# Patient Record
Sex: Male | Born: 1945 | Race: White | Hispanic: No | Marital: Single | Smoking: Former smoker
Health system: Southern US, Community
[De-identification: ages and names within clinical notes are randomized; demographics above are authoritative.]

## PROBLEM LIST (undated history)

## (undated) DIAGNOSIS — E7800 Pure hypercholesterolemia, unspecified: Secondary | ICD-10-CM

## (undated) DIAGNOSIS — F419 Anxiety disorder, unspecified: Secondary | ICD-10-CM

## (undated) DIAGNOSIS — R001 Bradycardia, unspecified: Secondary | ICD-10-CM

## (undated) DIAGNOSIS — H547 Unspecified visual loss: Secondary | ICD-10-CM

## (undated) DIAGNOSIS — H3530 Unspecified macular degeneration: Secondary | ICD-10-CM

## (undated) DIAGNOSIS — T07XXXA Unspecified multiple injuries, initial encounter: Secondary | ICD-10-CM

## (undated) DIAGNOSIS — M1990 Unspecified osteoarthritis, unspecified site: Secondary | ICD-10-CM

## (undated) DIAGNOSIS — Z87898 Personal history of other specified conditions: Secondary | ICD-10-CM

## (undated) DIAGNOSIS — J40 Bronchitis, not specified as acute or chronic: Secondary | ICD-10-CM

## (undated) HISTORY — PX: SEPTOPLASTY: SUR1290

## (undated) HISTORY — PX: CERVICAL FUSION: SHX112

---

## 2001-06-03 ENCOUNTER — Encounter: Payer: Self-pay | Admitting: Emergency Medicine

## 2001-06-03 ENCOUNTER — Emergency Department (HOSPITAL_COMMUNITY): Admission: EM | Admit: 2001-06-03 | Discharge: 2001-06-03 | Payer: Self-pay | Admitting: Emergency Medicine

## 2004-08-13 ENCOUNTER — Emergency Department (HOSPITAL_COMMUNITY): Admission: EM | Admit: 2004-08-13 | Discharge: 2004-08-13 | Payer: Self-pay | Admitting: Emergency Medicine

## 2013-01-08 ENCOUNTER — Other Ambulatory Visit (HOSPITAL_COMMUNITY): Payer: Self-pay | Admitting: Internal Medicine

## 2013-01-08 DIAGNOSIS — I714 Abdominal aortic aneurysm, without rupture: Secondary | ICD-10-CM

## 2013-01-12 ENCOUNTER — Ambulatory Visit (HOSPITAL_COMMUNITY)
Admission: RE | Admit: 2013-01-12 | Discharge: 2013-01-12 | Disposition: A | Discharge status: Home / Self Care | Payer: Medicare Other | Source: Ambulatory Visit | Attending: Internal Medicine | Admitting: Internal Medicine

## 2013-01-12 DIAGNOSIS — I714 Abdominal aortic aneurysm, without rupture: Secondary | ICD-10-CM

## 2013-01-12 DIAGNOSIS — I77811 Abdominal aortic ectasia: Secondary | ICD-10-CM | POA: Insufficient documentation

## 2013-01-12 DIAGNOSIS — Z1389 Encounter for screening for other disorder: Secondary | ICD-10-CM | POA: Insufficient documentation

## 2013-07-26 ENCOUNTER — Other Ambulatory Visit: Payer: Self-pay | Admitting: Orthopedic Surgery

## 2013-07-26 NOTE — Progress Notes (Signed)
Preoperative surgical orders have been place into the Epic hospital system for Vincent Gaines on 07/26/2013, 12:34 PM  by Patrica Duel for surgery on 08-13-2013.  Preop Total Knee orders including Experal, PO Tylenol, and IV Decadron as long as there are no contraindications to the above medications. Avel Peace, PA-C

## 2013-07-30 ENCOUNTER — Encounter (HOSPITAL_COMMUNITY): Payer: Self-pay | Admitting: Pharmacy Technician

## 2013-08-06 ENCOUNTER — Encounter (HOSPITAL_COMMUNITY)
Admission: RE | Admit: 2013-08-06 | Discharge: 2013-08-06 | Disposition: A | Discharge status: Home / Self Care | Payer: Medicare Other | Source: Ambulatory Visit | Attending: Orthopedic Surgery | Admitting: Orthopedic Surgery

## 2013-08-06 ENCOUNTER — Encounter (HOSPITAL_COMMUNITY): Payer: Self-pay

## 2013-08-06 ENCOUNTER — Encounter (INDEPENDENT_AMBULATORY_CARE_PROVIDER_SITE_OTHER): Payer: Self-pay

## 2013-08-06 DIAGNOSIS — J40 Bronchitis, not specified as acute or chronic: Secondary | ICD-10-CM

## 2013-08-06 DIAGNOSIS — Z01818 Encounter for other preprocedural examination: Secondary | ICD-10-CM | POA: Insufficient documentation

## 2013-08-06 DIAGNOSIS — Z01810 Encounter for preprocedural cardiovascular examination: Secondary | ICD-10-CM | POA: Insufficient documentation

## 2013-08-06 DIAGNOSIS — H547 Unspecified visual loss: Secondary | ICD-10-CM

## 2013-08-06 DIAGNOSIS — Z01812 Encounter for preprocedural laboratory examination: Secondary | ICD-10-CM | POA: Insufficient documentation

## 2013-08-06 HISTORY — DX: Anxiety disorder, unspecified: F41.9

## 2013-08-06 HISTORY — DX: Unspecified visual loss: H54.7

## 2013-08-06 HISTORY — DX: Personal history of other specified conditions: Z87.898

## 2013-08-06 HISTORY — DX: Unspecified multiple injuries, initial encounter: T07.XXXA

## 2013-08-06 HISTORY — DX: Bronchitis, not specified as acute or chronic: J40

## 2013-08-06 HISTORY — DX: Unspecified osteoarthritis, unspecified site: M19.90

## 2013-08-06 HISTORY — DX: Bradycardia, unspecified: R00.1

## 2013-08-06 HISTORY — PX: EXPLORATORY LAPAROTOMY: SUR591

## 2013-08-06 LAB — COMPREHENSIVE METABOLIC PANEL
AST: 17 U/L (ref 0–37)
Albumin: 4 g/dL (ref 3.5–5.2)
Alkaline Phosphatase: 52 U/L (ref 39–117)
BUN: 12 mg/dL (ref 6–23)
Potassium: 4.3 mEq/L (ref 3.5–5.1)
Total Protein: 7.1 g/dL (ref 6.0–8.3)

## 2013-08-06 LAB — APTT: aPTT: 30 seconds (ref 24–37)

## 2013-08-06 LAB — SURGICAL PCR SCREEN
MRSA, PCR: NEGATIVE
Staphylococcus aureus: NEGATIVE

## 2013-08-06 LAB — URINALYSIS, ROUTINE W REFLEX MICROSCOPIC
Nitrite: NEGATIVE
Specific Gravity, Urine: 1.005 (ref 1.005–1.030)
Urobilinogen, UA: 0.2 mg/dL (ref 0.0–1.0)

## 2013-08-06 LAB — CBC
HCT: 39.3 % (ref 39.0–52.0)
MCHC: 34.1 g/dL (ref 30.0–36.0)
Platelets: 248 10*3/uL (ref 150–400)
RDW: 13.8 % (ref 11.5–15.5)

## 2013-08-06 LAB — PROTIME-INR
INR: 1.01 (ref 0.00–1.49)
Prothrombin Time: 13.1 seconds (ref 11.6–15.2)

## 2013-08-06 NOTE — Pre-Procedure Instructions (Signed)
08-06-13 EKG done today.

## 2013-08-06 NOTE — Patient Instructions (Addendum)
20 JEZIAH KRETSCHMER  08/06/2013   Your procedure is scheduled on:  1-5 -2015  Report to [facility] at    0930    AM.  Call this number if you have problems the morning of surgery: [phone]  Or Presurgical Testing [phone](Makiyah Zentz) For Living Will and/or Health Care Power Attorney Forms: please provide copy for your medical record,may bring AM of surgery(Forms should be already notarized -we do not provide this service).     Do not eat food:After Midnight.(may have Clear liquid to drink- 12 midnight- 0600 AM-Apple juice, black coffee, water, clear broth, sodas.), then nothing .    Take these medicines the morning of surgery with A SIP OF WATER: Xanax. Simvastatin.   Do not wear jewelry, make-up or nail polish.  Do not wear lotions, powders, or perfumes. You may wear deodorant.  Do not shave 12 hours prior to first CHG shower(legs and under arms).(face and neck okay.)  Do not bring valuables to the hospital.(Hospital is not responsible for lost valuables).  Contacts, dentures or removable bridgework, body piercing, hair pins may not be worn into surgery.  Leave suitcase in the car. After surgery it may be brought to your room.  For patients admitted to the hospital, checkout time is 11:00 AM the day of discharge.   Patients discharged the day of surgery will not be allowed to drive home. Must have responsible person with you x 24 hours once discharged.  Name and phone number of your driver: Akash Winski [phone] cell  Special Instructions: CHG(Chlorhedine 4%-"Hibiclens","Betasept","Aplicare") Shower Use Special Wash: see special instructions.(avoid face and genitals)   Please read over the following fact sheets that you were given: MRSA Information, Blood Transfusion fact sheet, Incentive Spirometry Instruction.  Remember : Type/Screen "Blue armbands" - may not be removed once applied(would result in being retested AM of surgery, if  removed).  Failure to follow these instructions may result in Cancellation of your surgery.   Patient signature_______________________________________________________

## 2013-08-13 ENCOUNTER — Encounter (HOSPITAL_COMMUNITY)
Admission: RE | Disposition: A | Discharge status: Home Health | Payer: Self-pay | Source: Ambulatory Visit | Attending: Orthopedic Surgery

## 2013-08-13 ENCOUNTER — Encounter (HOSPITAL_COMMUNITY): Payer: Self-pay | Admitting: Orthopedic Surgery

## 2013-08-13 ENCOUNTER — Inpatient Hospital Stay (HOSPITAL_COMMUNITY)
Admission: RE | Admit: 2013-08-13 | Discharge: 2013-08-15 | DRG: 470 | Disposition: A | Discharge status: Home Health | Payer: Medicare Other | Source: Ambulatory Visit | Attending: Orthopedic Surgery | Admitting: Orthopedic Surgery

## 2013-08-13 ENCOUNTER — Inpatient Hospital Stay (HOSPITAL_COMMUNITY): Payer: Medicare Other | Admitting: Certified Registered Nurse Anesthetist

## 2013-08-13 ENCOUNTER — Other Ambulatory Visit: Payer: Self-pay | Admitting: Orthopedic Surgery

## 2013-08-13 ENCOUNTER — Encounter (HOSPITAL_COMMUNITY): Payer: Medicare Other | Admitting: Certified Registered Nurse Anesthetist

## 2013-08-13 DIAGNOSIS — Z87891 Personal history of nicotine dependence: Secondary | ICD-10-CM

## 2013-08-13 DIAGNOSIS — H353290 Exudative age-related macular degeneration, unspecified eye, stage unspecified: Secondary | ICD-10-CM | POA: Diagnosis present

## 2013-08-13 DIAGNOSIS — H547 Unspecified visual loss: Secondary | ICD-10-CM | POA: Diagnosis present

## 2013-08-13 DIAGNOSIS — D62 Acute posthemorrhagic anemia: Secondary | ICD-10-CM | POA: Diagnosis not present

## 2013-08-13 DIAGNOSIS — F411 Generalized anxiety disorder: Secondary | ICD-10-CM | POA: Diagnosis present

## 2013-08-13 DIAGNOSIS — M179 Osteoarthritis of knee, unspecified: Secondary | ICD-10-CM | POA: Diagnosis present

## 2013-08-13 DIAGNOSIS — Z6825 Body mass index (BMI) 25.0-25.9, adult: Secondary | ICD-10-CM

## 2013-08-13 DIAGNOSIS — E7800 Pure hypercholesterolemia, unspecified: Secondary | ICD-10-CM | POA: Diagnosis present

## 2013-08-13 DIAGNOSIS — M1710 Unilateral primary osteoarthritis, unspecified knee: Principal | ICD-10-CM | POA: Diagnosis present

## 2013-08-13 DIAGNOSIS — Z96652 Presence of left artificial knee joint: Secondary | ICD-10-CM

## 2013-08-13 DIAGNOSIS — M1712 Unilateral primary osteoarthritis, left knee: Secondary | ICD-10-CM | POA: Diagnosis present

## 2013-08-13 DIAGNOSIS — E871 Hypo-osmolality and hyponatremia: Secondary | ICD-10-CM | POA: Diagnosis not present

## 2013-08-13 HISTORY — PX: TOTAL KNEE ARTHROPLASTY: SHX125

## 2013-08-13 HISTORY — DX: Pure hypercholesterolemia, unspecified: E78.00

## 2013-08-13 LAB — TYPE AND SCREEN
ABO/RH(D): B POS
Antibody Screen: NEGATIVE

## 2013-08-13 LAB — ABO/RH: ABO/RH(D): B POS

## 2013-08-13 SURGERY — ARTHROPLASTY, KNEE, TOTAL
Anesthesia: Spinal | Site: Knee | Laterality: Left

## 2013-08-13 MED ORDER — METOCLOPRAMIDE HCL 5 MG/ML IJ SOLN: 5.0000 mg | Freq: Three times a day (TID) | INTRAMUSCULAR | Status: DC | PRN

## 2013-08-13 MED ORDER — ONDANSETRON HCL 4 MG/2ML IJ SOLN
INTRAMUSCULAR | Status: AC
  Filled 2013-08-13: qty 2

## 2013-08-13 MED ORDER — BUPIVACAINE HCL (PF) 0.25 % IJ SOLN
INTRAMUSCULAR | Status: AC
  Filled 2013-08-13: qty 30

## 2013-08-13 MED ORDER — FLEET ENEMA 7-19 GM/118ML RE ENEM
1.0000 | ENEMA | Freq: Once | RECTAL | Status: AC | PRN
Start: 2013-08-13 — End: 2013-08-13

## 2013-08-13 MED ORDER — SODIUM CHLORIDE 0.9 % IJ SOLN
INTRAMUSCULAR | Status: AC
  Filled 2013-08-13: qty 50

## 2013-08-13 MED ORDER — METHOCARBAMOL 100 MG/ML IJ SOLN
500.0000 mg | Freq: Four times a day (QID) | INTRAVENOUS | Status: DC | PRN
  Administered 2013-08-13: 500 mg via INTRAVENOUS
  Filled 2013-08-13: qty 5

## 2013-08-13 MED ORDER — PROPOFOL 10 MG/ML IV BOLUS
INTRAVENOUS | Status: AC
  Filled 2013-08-13: qty 20

## 2013-08-13 MED ORDER — DEXAMETHASONE SODIUM PHOSPHATE 10 MG/ML IJ SOLN
10.0000 mg | Freq: Every day | INTRAMUSCULAR | Status: AC
  Filled 2013-08-13: qty 1

## 2013-08-13 MED ORDER — ONDANSETRON HCL 4 MG/2ML IJ SOLN
INTRAMUSCULAR | Status: DC | PRN
  Administered 2013-08-13: 4 mg via INTRAVENOUS

## 2013-08-13 MED ORDER — OXYCODONE HCL 5 MG PO TABS
5.0000 mg | ORAL_TABLET | ORAL | Status: DC | PRN
  Administered 2013-08-13 (×3): 5 mg via ORAL
  Administered 2013-08-13 – 2013-08-15 (×7): 10 mg via ORAL
  Filled 2013-08-13 (×2): qty 2
  Filled 2013-08-13: qty 1
  Filled 2013-08-13: qty 2
  Filled 2013-08-13: qty 1
  Filled 2013-08-13 (×2): qty 2
  Filled 2013-08-13: qty 1
  Filled 2013-08-13 (×2): qty 2

## 2013-08-13 MED ORDER — KETOROLAC TROMETHAMINE 15 MG/ML IJ SOLN
INTRAMUSCULAR | Status: AC
Start: 2013-08-13 — End: 2013-08-14
  Filled 2013-08-13: qty 1

## 2013-08-13 MED ORDER — ACETAMINOPHEN 500 MG PO TABS
1000.0000 mg | ORAL_TABLET | Freq: Four times a day (QID) | ORAL | Status: AC
  Administered 2013-08-13 – 2013-08-14 (×4): 1000 mg via ORAL
  Filled 2013-08-13 (×5): qty 2

## 2013-08-13 MED ORDER — SIMVASTATIN 40 MG PO TABS
40.0000 mg | ORAL_TABLET | Freq: Every day | ORAL | Status: DC
  Administered 2013-08-14 – 2013-08-15 (×2): 40 mg via ORAL
  Filled 2013-08-13 (×3): qty 1

## 2013-08-13 MED ORDER — PROMETHAZINE HCL 25 MG/ML IJ SOLN: 6.2500 mg | INTRAMUSCULAR | Status: DC | PRN

## 2013-08-13 MED ORDER — PROPOFOL INFUSION 10 MG/ML OPTIME
INTRAVENOUS | Status: DC | PRN
  Administered 2013-08-13: 140 ug/kg/min via INTRAVENOUS

## 2013-08-13 MED ORDER — CHLORHEXIDINE GLUCONATE 4 % EX LIQD: 60.0000 mL | Freq: Once | CUTANEOUS | Status: DC

## 2013-08-13 MED ORDER — FENTANYL CITRATE 0.05 MG/ML IJ SOLN: 25.0000 ug | INTRAMUSCULAR | Status: DC | PRN

## 2013-08-13 MED ORDER — METOCLOPRAMIDE HCL 10 MG PO TABS: 5.0000 mg | ORAL_TABLET | Freq: Three times a day (TID) | ORAL | Status: DC | PRN

## 2013-08-13 MED ORDER — MEPERIDINE HCL 50 MG/ML IJ SOLN: 6.2500 mg | INTRAMUSCULAR | Status: DC | PRN

## 2013-08-13 MED ORDER — DOCUSATE SODIUM 100 MG PO CAPS
100.0000 mg | ORAL_CAPSULE | Freq: Two times a day (BID) | ORAL | Status: DC
  Administered 2013-08-13 – 2013-08-15 (×4): 100 mg via ORAL

## 2013-08-13 MED ORDER — BUPIVACAINE LIPOSOME 1.3 % IJ SUSP
20.0000 mL | Freq: Once | INTRAMUSCULAR | Status: DC
  Filled 2013-08-13: qty 20

## 2013-08-13 MED ORDER — KETOROLAC TROMETHAMINE 15 MG/ML IJ SOLN
7.5000 mg | Freq: Four times a day (QID) | INTRAMUSCULAR | Status: AC | PRN
  Administered 2013-08-13: 7.5 mg via INTRAVENOUS

## 2013-08-13 MED ORDER — ONDANSETRON HCL 4 MG PO TABS: 4.0000 mg | ORAL_TABLET | Freq: Four times a day (QID) | ORAL | Status: DC | PRN

## 2013-08-13 MED ORDER — MORPHINE SULFATE 2 MG/ML IJ SOLN: 1.0000 mg | INTRAMUSCULAR | Status: DC | PRN

## 2013-08-13 MED ORDER — SODIUM CHLORIDE 0.9 % IV SOLN
INTRAVENOUS | Status: DC
Start: 2013-08-13 — End: 2013-08-13

## 2013-08-13 MED ORDER — MIDAZOLAM HCL 2 MG/2ML IJ SOLN
INTRAMUSCULAR | Status: AC
  Filled 2013-08-13: qty 2

## 2013-08-13 MED ORDER — CEFAZOLIN SODIUM-DEXTROSE 2-3 GM-% IV SOLR
INTRAVENOUS | Status: AC
  Filled 2013-08-13: qty 50

## 2013-08-13 MED ORDER — DEXAMETHASONE SODIUM PHOSPHATE 10 MG/ML IJ SOLN
10.0000 mg | Freq: Once | INTRAMUSCULAR | Status: AC
  Administered 2013-08-13: 10 mg via INTRAVENOUS

## 2013-08-13 MED ORDER — BUPIVACAINE IN DEXTROSE 0.75-8.25 % IT SOLN
INTRATHECAL | Status: DC | PRN
  Administered 2013-08-13: 1.5 mL via INTRATHECAL

## 2013-08-13 MED ORDER — RIVAROXABAN 10 MG PO TABS
10.0000 mg | ORAL_TABLET | Freq: Every day | ORAL | Status: DC
  Administered 2013-08-14 – 2013-08-15 (×2): 10 mg via ORAL
  Filled 2013-08-13 (×3): qty 1

## 2013-08-13 MED ORDER — ONDANSETRON HCL 4 MG/2ML IJ SOLN: 4.0000 mg | Freq: Four times a day (QID) | INTRAMUSCULAR | Status: DC | PRN

## 2013-08-13 MED ORDER — 0.9 % SODIUM CHLORIDE (POUR BTL) OPTIME
TOPICAL | Status: DC | PRN
  Administered 2013-08-13: 1000 mL

## 2013-08-13 MED ORDER — DEXAMETHASONE 6 MG PO TABS
10.0000 mg | ORAL_TABLET | Freq: Every day | ORAL | Status: AC
  Administered 2013-08-14: 10 mg via ORAL
  Filled 2013-08-13: qty 1

## 2013-08-13 MED ORDER — LIDOCAINE HCL (CARDIAC) 20 MG/ML IV SOLN
INTRAVENOUS | Status: DC | PRN
  Administered 2013-08-13: 100 mg via INTRAVENOUS

## 2013-08-13 MED ORDER — ALPRAZOLAM 0.5 MG PO TABS
0.5000 mg | ORAL_TABLET | Freq: Three times a day (TID) | ORAL | Status: DC | PRN
  Administered 2013-08-13 – 2013-08-15 (×5): 0.5 mg via ORAL
  Filled 2013-08-13 (×5): qty 1

## 2013-08-13 MED ORDER — ACETAMINOPHEN 10 MG/ML IV SOLN
1000.0000 mg | Freq: Once | INTRAVENOUS | Status: AC
  Administered 2013-08-13: 1000 mg via INTRAVENOUS
  Filled 2013-08-13: qty 100

## 2013-08-13 MED ORDER — LACTATED RINGERS IV SOLN
INTRAVENOUS | Status: DC | PRN
  Administered 2013-08-13: 12:00:00 via INTRAVENOUS

## 2013-08-13 MED ORDER — MENTHOL 3 MG MT LOZG: 1.0000 | LOZENGE | OROMUCOSAL | Status: DC | PRN

## 2013-08-13 MED ORDER — MIDAZOLAM HCL 5 MG/5ML IJ SOLN
INTRAMUSCULAR | Status: DC | PRN
  Administered 2013-08-13: 2 mg via INTRAVENOUS

## 2013-08-13 MED ORDER — SODIUM CHLORIDE 0.9 % IR SOLN
Status: DC | PRN
  Administered 2013-08-13: 1000 mL

## 2013-08-13 MED ORDER — ACETAMINOPHEN 325 MG PO TABS: 650.0000 mg | ORAL_TABLET | Freq: Four times a day (QID) | ORAL | Status: DC | PRN

## 2013-08-13 MED ORDER — BISACODYL 10 MG RE SUPP: 10.0000 mg | Freq: Every day | RECTAL | Status: DC | PRN

## 2013-08-13 MED ORDER — METHOCARBAMOL 500 MG PO TABS
500.0000 mg | ORAL_TABLET | Freq: Four times a day (QID) | ORAL | Status: DC | PRN
  Administered 2013-08-13 – 2013-08-15 (×4): 500 mg via ORAL
  Filled 2013-08-13 (×4): qty 1

## 2013-08-13 MED ORDER — NICOTINE 14 MG/24HR TD PT24
14.0000 mg | MEDICATED_PATCH | Freq: Every day | TRANSDERMAL | Status: DC
  Administered 2013-08-13 – 2013-08-14 (×2): 14 mg via TRANSDERMAL
  Filled 2013-08-13 (×3): qty 1

## 2013-08-13 MED ORDER — SODIUM CHLORIDE 0.9 % IJ SOLN
INTRAMUSCULAR | Status: DC | PRN
  Administered 2013-08-13: 14:00:00

## 2013-08-13 MED ORDER — DEXTROSE-NACL 5-0.9 % IV SOLN
INTRAVENOUS | Status: DC
  Administered 2013-08-13 – 2013-08-14 (×3): via INTRAVENOUS

## 2013-08-13 MED ORDER — TRAMADOL HCL 50 MG PO TABS: 50.0000 mg | ORAL_TABLET | Freq: Four times a day (QID) | ORAL | Status: DC | PRN

## 2013-08-13 MED ORDER — POLYETHYLENE GLYCOL 3350 17 G PO PACK
17.0000 g | PACK | Freq: Every day | ORAL | Status: DC | PRN
Start: 2013-08-13 — End: 2013-08-15

## 2013-08-13 MED ORDER — ACETAMINOPHEN 650 MG RE SUPP: 650.0000 mg | Freq: Four times a day (QID) | RECTAL | Status: DC | PRN

## 2013-08-13 MED ORDER — LIDOCAINE HCL (CARDIAC) 20 MG/ML IV SOLN
INTRAVENOUS | Status: AC
  Filled 2013-08-13: qty 5

## 2013-08-13 MED ORDER — CEFAZOLIN SODIUM-DEXTROSE 2-3 GM-% IV SOLR
2.0000 g | Freq: Four times a day (QID) | INTRAVENOUS | Status: AC
  Administered 2013-08-13 – 2013-08-14 (×2): 2 g via INTRAVENOUS
  Filled 2013-08-13 (×2): qty 50

## 2013-08-13 MED ORDER — DIPHENHYDRAMINE HCL 12.5 MG/5ML PO ELIX: 12.5000 mg | ORAL_SOLUTION | ORAL | Status: DC | PRN

## 2013-08-13 MED ORDER — CEFAZOLIN SODIUM-DEXTROSE 2-3 GM-% IV SOLR
2.0000 g | INTRAVENOUS | Status: AC
  Administered 2013-08-13: 2 g via INTRAVENOUS

## 2013-08-13 MED ORDER — DEXAMETHASONE SODIUM PHOSPHATE 10 MG/ML IJ SOLN
INTRAMUSCULAR | Status: AC
  Filled 2013-08-13: qty 1

## 2013-08-13 MED ORDER — BUPIVACAINE HCL 0.25 % IJ SOLN
INTRAMUSCULAR | Status: DC | PRN
  Administered 2013-08-13: 20 mL

## 2013-08-13 MED ORDER — PHENOL 1.4 % MT LIQD: 1.0000 | OROMUCOSAL | Status: DC | PRN

## 2013-08-13 MED ORDER — LACTATED RINGERS IV SOLN: INTRAVENOUS | Status: DC

## 2013-08-13 SURGICAL SUPPLY — 59 items
BAG SPEC THK2 15X12 ZIP CLS (MISCELLANEOUS) ×1
BAG ZIPLOCK 12X15 (MISCELLANEOUS) ×3 IMPLANT
BANDAGE ELASTIC 6 VELCRO ST LF (GAUZE/BANDAGES/DRESSINGS) ×3 IMPLANT
BANDAGE ESMARK 6X9 LF (GAUZE/BANDAGES/DRESSINGS) ×1 IMPLANT
BLADE SAG 18X100X1.27 (BLADE) ×3 IMPLANT
BLADE SAW SGTL 11.0X1.19X90.0M (BLADE) ×3 IMPLANT
BNDG CMPR 9X6 STRL LF SNTH (GAUZE/BANDAGES/DRESSINGS) ×1
BNDG ESMARK 6X9 LF (GAUZE/BANDAGES/DRESSINGS) ×3
BOWL SMART MIX CTS (DISPOSABLE) ×3 IMPLANT
CAPT RP KNEE ×2 IMPLANT
CEMENT HV SMART SET (Cement) ×6 IMPLANT
CLOSURE WOUND 1/2 X4 (GAUZE/BANDAGES/DRESSINGS) ×1
CUFF TOURN SGL QUICK 34 (TOURNIQUET CUFF) ×3
CUFF TRNQT CYL 34X4X40X1 (TOURNIQUET CUFF) ×1 IMPLANT
DECANTER SPIKE VIAL GLASS SM (MISCELLANEOUS) ×3 IMPLANT
DRAPE EXTREMITY T 121X128X90 (DRAPE) ×3 IMPLANT
DRAPE POUCH INSTRU U-SHP 10X18 (DRAPES) ×3 IMPLANT
DRAPE U-SHAPE 47X51 STRL (DRAPES) ×3 IMPLANT
DRSG ADAPTIC 3X8 NADH LF (GAUZE/BANDAGES/DRESSINGS) ×3 IMPLANT
DURAPREP 26ML APPLICATOR (WOUND CARE) ×3 IMPLANT
ELECT REM PT RETURN 9FT ADLT (ELECTROSURGICAL) ×3
ELECTRODE REM PT RTRN 9FT ADLT (ELECTROSURGICAL) ×1 IMPLANT
EVACUATOR 1/8 PVC DRAIN (DRAIN) ×3 IMPLANT
FACESHIELD LNG OPTICON STERILE (SAFETY) ×15 IMPLANT
GLOVE BIO SURGEON STRL SZ7.5 (GLOVE) IMPLANT
GLOVE BIO SURGEON STRL SZ8 (GLOVE) ×3 IMPLANT
GLOVE BIOGEL PI IND STRL 8 (GLOVE) ×2 IMPLANT
GLOVE BIOGEL PI INDICATOR 8 (GLOVE) ×4
GLOVE SURG SS PI 6.5 STRL IVOR (GLOVE) IMPLANT
GOWN PREVENTION PLUS LG XLONG (DISPOSABLE) ×3 IMPLANT
GOWN STRL REIN XL XLG (GOWN DISPOSABLE) IMPLANT
HANDPIECE INTERPULSE COAX TIP (DISPOSABLE) ×3
IMMOBILIZER KNEE 20 (SOFTGOODS) ×3
IMMOBILIZER KNEE 20 THIGH 36 (SOFTGOODS) ×1 IMPLANT
KIT BASIN OR (CUSTOM PROCEDURE TRAY) ×3 IMPLANT
MANIFOLD NEPTUNE II (INSTRUMENTS) ×3 IMPLANT
NDL SAFETY ECLIPSE 18X1.5 (NEEDLE) ×2 IMPLANT
NEEDLE HYPO 18GX1.5 SHARP (NEEDLE) ×6
NS IRRIG 1000ML POUR BTL (IV SOLUTION) ×3 IMPLANT
PACK TOTAL JOINT (CUSTOM PROCEDURE TRAY) ×3 IMPLANT
PAD ABD 8X10 STRL (GAUZE/BANDAGES/DRESSINGS) ×3 IMPLANT
PADDING CAST ABS 6INX4YD NS (CAST SUPPLIES) ×2
PADDING CAST ABS COTTON 6X4 NS (CAST SUPPLIES) IMPLANT
PADDING CAST COTTON 6X4 STRL (CAST SUPPLIES) ×9 IMPLANT
POSITIONER SURGICAL ARM (MISCELLANEOUS) ×3 IMPLANT
SET HNDPC FAN SPRY TIP SCT (DISPOSABLE) ×1 IMPLANT
SPONGE GAUZE 4X4 12PLY (GAUZE/BANDAGES/DRESSINGS) ×3 IMPLANT
STRIP CLOSURE SKIN 1/2X4 (GAUZE/BANDAGES/DRESSINGS) ×3 IMPLANT
SUCTION FRAZIER 12FR DISP (SUCTIONS) ×3 IMPLANT
SUT MNCRL AB 4-0 PS2 18 (SUTURE) ×3 IMPLANT
SUT VIC AB 2-0 CT1 27 (SUTURE) ×9
SUT VIC AB 2-0 CT1 TAPERPNT 27 (SUTURE) ×3 IMPLANT
SUT VLOC 180 0 24IN GS25 (SUTURE) ×3 IMPLANT
SYR 20CC LL (SYRINGE) ×3 IMPLANT
SYR 50ML LL SCALE MARK (SYRINGE) ×3 IMPLANT
TOWEL OR 17X26 10 PK STRL BLUE (TOWEL DISPOSABLE) ×6 IMPLANT
TRAY FOLEY CATH 14FRSI W/METER (CATHETERS) ×3 IMPLANT
WATER STERILE IRR 1500ML POUR (IV SOLUTION) ×5 IMPLANT
WRAP KNEE MAXI GEL POST OP (GAUZE/BANDAGES/DRESSINGS) ×3 IMPLANT

## 2013-08-13 NOTE — Anesthesia Procedure Notes (Signed)
Spinal  Patient location during procedure: OR Staffing Anesthesiologist: Intisar Claudio Performed by: anesthesiologist  Preanesthetic Checklist Completed: patient identified, site marked, surgical consent, pre-op evaluation, timeout performed, IV checked, risks and benefits discussed and monitors and equipment checked Spinal Block Patient position: sitting Prep: Betadine Patient monitoring: heart rate, continuous pulse ox and blood pressure Approach: right paramedian Location: L3-4 Injection technique: single-shot Needle Needle type: Sprotte  Needle gauge: 24 G Needle length: 9 cm Additional Notes Expiration date of kit checked and confirmed. Patient tolerated procedure well, without complications.     

## 2013-08-13 NOTE — Progress Notes (Signed)
Utilization review completed.  

## 2013-08-13 NOTE — Transfer of Care (Signed)
Immediate Anesthesia Transfer of Care Note  Patient: Vincent Gaines  Procedure(s) Performed: Procedure(s) (LRB): LEFT TOTAL KNEE ARTHROPLASTY (Left)  Patient Location: PACU  Anesthesia Type: Spinal  Level of Consciousness: sedated, patient cooperative and responds to stimulation  Airway & Oxygen Therapy: Patient Spontanous Breathing and Patient connected to face mask oxgen  Post-op Assessment: Report given to PACU RN and Post -op Vital signs reviewed and stable  Post vital signs: Reviewed and stable  Complications: No apparent anesthesia complications

## 2013-08-13 NOTE — Plan of Care (Signed)
Problem: Consults Goal: Diagnosis- Total Joint Replacement Primary Total Knee     

## 2013-08-13 NOTE — Preoperative (Signed)
Beta Blockers   Reason not to administer Beta Blockers:Not Applicable 

## 2013-08-13 NOTE — Op Note (Signed)
Pre-operative diagnosis- Osteoarthritis  Left knee(s)  Post-operative diagnosis- Osteoarthritis Left knee(s)  Procedure-  Left  Total Knee Arthroplasty  Surgeon- Dione Plover. Cydney Alvarenga, MD  Assistant- Arlee Muslim, PA-C   Anesthesia-  Spinal EBL-* No blood loss amount entered *  Drains Hemovac  Tourniquet time-  Total Tourniquet Time Documented: Thigh (Left) - 32 minutes Total: Thigh (Left) - 32 minutes    Complications- None  Condition-PACU - hemodynamically stable.   Brief Clinical Note   Vincent Gaines is a 68 y.o. year old male with end stage OA of his left knee with progressively worsening pain and dysfunction. He has constant pain, with activity and at rest and significant functional deficits with difficulties even with ADLs. He has had extensive non-op management including analgesics, injections of cortisone and viscosupplements, and home exercise program, but remains in significant pain with significant dysfunction. Radiographs show bone on bone arthritis medial and patellofemoral with significant varus deformity. He presents now for left Total Knee Arthroplasty.     Procedure in detail---   The patient is brought into the operating room and positioned supine on the operating table. After successful administration of  Spinal,   a tourniquet is placed high on the  Left thigh(s) and the lower extremity is prepped and draped in the usual sterile fashion. Time out is performed by the operating team and then the  Left lower extremity is wrapped in Esmarch, knee flexed and the tourniquet inflated to 300 mmHg.       A midline incision is made with a ten blade through the subcutaneous tissue to the level of the extensor mechanism. A fresh blade is used to make a medial parapatellar arthrotomy. Soft tissue over the proximal medial tibia is subperiosteally elevated to the joint line with a knife and into the semimembranosus bursa with a Cobb elevator. Soft tissue over the proximal lateral  tibia is elevated with attention being paid to avoiding the patellar tendon on the tibial tubercle. The patella is everted, knee flexed 90 degrees and the ACL and PCL are removed. Findings are bone on bone medial and patellofemoral with large global osteophytes.      The drill is used to create a starting hole in the distal femur and the canal is thoroughly irrigated with sterile saline to remove the fatty contents. The 5 degree Left  valgus alignment guide is placed into the femoral canal and the distal femoral cutting block is pinned to remove 10 mm off the distal femur. Resection is made with an oscillating saw.      The tibia is subluxed forward and the menisci are removed. The extramedullary alignment guide is placed referencing proximally at the medial aspect of the tibial tubercle and distally along the second metatarsal axis and tibial crest. The block is pinned to remove 17mm off the more deficient medial  side. Resection is made with an oscillating saw. Size 4is the most appropriate size for the tibia and the proximal tibia is prepared with the modular drill and keel punch for that size.      The femoral sizing guide is placed and size 4 narrow is most appropriate. Rotation is marked off the epicondylar axis and confirmed by creating a rectangular flexion gap at 90 degrees. The size 4 cutting block is pinned in this rotation and the anterior, posterior and chamfer cuts are made with the oscillating saw. The intercondylar block is then placed and that cut is made.      Trial size 4  tibial component, trial size 4 narrow posterior stabilized femur and a 12.5  mm posterior stabilized rotating platform insert trial is placed. Full extension is achieved with excellent varus/valgus and anterior/posterior balance throughout full range of motion. The patella is everted and thickness measured to be 27  mm. Free hand resection is taken to 15 mm, a 41 template is placed, lug holes are drilled, trial patella is  placed, and it tracks normally. Osteophytes are removed off the posterior femur with the trial in place. All trials are removed and the cut bone surfaces prepared with pulsatile lavage. Cement is mixed and once ready for implantation, the size 4 tibial implant, size  4 narrow posterior stabilized femoral component, and the size 41 patella are cemented in place and the patella is held with the clamp. The trial insert is placed and the knee held in full extension. The Exparel (20 ml mixed with 30 ml saline) and .25% Bupivicaine, are injected into the extensor mechanism, posterior capsule, medial and lateral gutters and subcutaneous tissues.  All extruded cement is removed and once the cement is hard the permanent 12.5 mm posterior stabilized rotating platform insert is placed into the tibial tray.      The wound is copiously irrigated with saline solution and the extensor mechanism closed over a hemovac drain with #1 PDS suture. The tourniquet is released for a total tourniquet time of 31  minutes. Flexion against gravity is 140 degrees and the patella tracks normally. Subcutaneous tissue is closed with 2.0 vicryl and subcuticular with running 4.0 Monocryl. The incision is cleaned and dried and steri-strips and a bulky sterile dressing are applied. The limb is placed into a knee immobilizer and the patient is awakened and transported to recovery in stable condition.      Please note that a surgical assistant was a medical necessity for this procedure in order to perform it in a safe and expeditious manner. Surgical assistant was necessary to retract the ligaments and vital neurovascular structures to prevent injury to them and also necessary for proper positioning of the limb to allow for anatomic placement of the prosthesis.   Dione Plover Kihanna Kamiya, MD    08/13/2013, 1:59 PM

## 2013-08-13 NOTE — Anesthesia Preprocedure Evaluation (Signed)
Anesthesia Evaluation  Patient identified by MRN, date of birth, ID band Patient awake    Reviewed: Allergy & Precautions, H&P , NPO status , Patient's Chart, lab work & pertinent test results  Airway Mallampati: II TM Distance: >3 FB Neck ROM: Full    Dental no notable dental hx.    Pulmonary neg pulmonary ROS, former smoker,  breath sounds clear to auscultation  Pulmonary exam normal       Cardiovascular negative cardio ROS  Rhythm:Regular Rate:Normal     Neuro/Psych negative neurological ROS  negative psych ROS   GI/Hepatic negative GI ROS, Neg liver ROS,   Endo/Other  negative endocrine ROS  Renal/GU negative Renal ROS  negative genitourinary   Musculoskeletal negative musculoskeletal ROS (+)   Abdominal   Peds negative pediatric ROS (+)  Hematology negative hematology ROS (+)   Anesthesia Other Findings   Reproductive/Obstetrics negative OB ROS                           Anesthesia Physical Anesthesia Plan  ASA: II  Anesthesia Plan: Spinal   Post-op Pain Management:    Induction: Intravenous  Airway Management Planned: Mask  Additional Equipment:   Intra-op Plan:   Post-operative Plan: Extubation in OR  Informed Consent: I have reviewed the patients History and Physical, chart, labs and discussed the procedure including the risks, benefits and alternatives for the proposed anesthesia with the patient or authorized representative who has indicated his/her understanding and acceptance.   Dental advisory given  Plan Discussed with: CRNA  Anesthesia Plan Comments:         Anesthesia Quick Evaluation
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
## 2013-08-13 NOTE — Interval H&P Note (Signed)
History and Physical Interval Note:  08/13/2013 9:50 AM  Vincent Gaines  has presented today for surgery, with the diagnosis of osteoarthritis of the left knee  The various methods of treatment have been discussed with the patient and family. After consideration of risks, benefits and other options for treatment, the patient has consented to  Procedure(s): LEFT TOTAL KNEE ARTHROPLASTY (Left) as a surgical intervention .  The patient's history has been reviewed, patient examined, no change in status, stable for surgery.  I have reviewed the patient's chart and labs.  Questions were answered to the patient's satisfaction.     Gearlean Alf
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
## 2013-08-14 ENCOUNTER — Encounter (HOSPITAL_COMMUNITY): Payer: Self-pay | Admitting: Orthopedic Surgery

## 2013-08-14 DIAGNOSIS — E871 Hypo-osmolality and hyponatremia: Secondary | ICD-10-CM | POA: Diagnosis not present

## 2013-08-14 DIAGNOSIS — D62 Acute posthemorrhagic anemia: Secondary | ICD-10-CM | POA: Diagnosis not present

## 2013-08-14 LAB — BASIC METABOLIC PANEL
BUN: 14 mg/dL (ref 6–23)
CO2: 24 mEq/L (ref 19–32)
Calcium: 8.3 mg/dL — ABNORMAL LOW (ref 8.4–10.5)
Chloride: 102 mEq/L (ref 96–112)
Creatinine, Ser: 0.88 mg/dL (ref 0.50–1.35)
GFR calc non Af Amer: 87 mL/min — ABNORMAL LOW (ref >90)
GLUCOSE: 160 mg/dL — AB (ref 70–99)
Potassium: 4.1 mEq/L (ref 3.7–5.3)
Sodium: 136 mEq/L — ABNORMAL LOW (ref 137–147)

## 2013-08-14 LAB — CBC
HEMATOCRIT: 31.1 % — AB (ref 39.0–52.0)
Hemoglobin: 10.6 g/dL — ABNORMAL LOW (ref 13.0–17.0)
MCH: 31.8 pg (ref 26.0–34.0)
MCHC: 34.1 g/dL (ref 30.0–36.0)
MCV: 93.4 fL (ref 78.0–100.0)
Platelets: 227 10*3/uL (ref 150–400)
RBC: 3.33 MIL/uL — ABNORMAL LOW (ref 4.22–5.81)
RDW: 13.6 % (ref 11.5–15.5)
WBC: 16.3 10*3/uL — ABNORMAL HIGH (ref 4.0–10.5)

## 2013-08-14 MED ORDER — SODIUM CHLORIDE 0.9 % IV BOLUS (SEPSIS)
500.0000 mL | Freq: Once | INTRAVENOUS | Status: AC
  Administered 2013-08-14: 500 mL via INTRAVENOUS
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
## 2013-08-14 NOTE — Care Management Note (Addendum)
    Page 1 of 2   08/15/2013     10:43:54 AM   CARE MANAGEMENT NOTE 08/15/2013  Patient:  Vincent Gaines, Vincent Gaines   Account Number:  [id number]  Date Initiated:  08/14/2013  Documentation initiated by:  Sunday Spillers  Subjective/Objective Assessment:   68 yo male admitted s/p Left  Total Knee Arthroplasty.  PTA lived at home alone.     Action/Plan:   home when stable   Anticipated DC Date:  08/15/2013   Anticipated DC Plan:  [place]  CM consult      [place]   Choice offered to / List presented to:  C-1 Patient   DME arranged  Vassie Moselle      DME agency  [place] arranged  [place].   Status of service:  Completed, signed off Medicare Important Message given?   (If response is "NO", the following Medicare IM given date fields will be blank) Date Medicare IM given:   Date Additional Medicare IM given:    Discharge Disposition:  [place]  Per UR Regulation:  Reviewed for med. necessity/level of care/duration of stay  If discussed at [place] of Stay Meetings, dates discussed:    Comments:

## 2013-08-14 NOTE — Anesthesia Postprocedure Evaluation (Signed)
  Anesthesia Post-op Note  Patient: Vincent Gaines  Procedure(s) Performed: Procedure(s) (LRB): LEFT TOTAL KNEE ARTHROPLASTY (Left)  Patient Location: PACU  Anesthesia Type: General  Level of Consciousness: awake and alert   Airway and Oxygen Therapy: Patient Spontanous Breathing  Post-op Pain: mild  Post-op Assessment: Post-op Vital signs reviewed, Patient's Cardiovascular Status Stable, Respiratory Function Stable, Patent Airway and No signs of Nausea or vomiting  Last Vitals:  Filed Vitals:   08/14/13 1009  BP: 111/64  Pulse: 68  Temp: 36.3 C  Resp: 16    Post-op Vital Signs: stable   Complications: No apparent anesthesia complications

## 2013-08-14 NOTE — Evaluation (Signed)
Occupational Therapy Evaluation Patient Details Name: AMARE BAIL MRN: [id number] DOB: February 22, 1946 Today's Date: 08/14/2013 Time: 1443-1510 OT Time Calculation (min): 27 min  OT Assessment / Plan / Recommendation History of present illness s/p L TKR   Clinical Impression   Pt was seen for initial OT eval and all education was completed. Pt does not need any further OT at this time.      OT Assessment  Patient does not need any further OT services    Follow Up Recommendations  No OT follow up    Barriers to Discharge      Equipment Recommendations  Other (comment) (pt may borrow tub/shower seat)    Recommendations for Other Services    Frequency       Precautions / Restrictions Precautions Precautions: Knee Precaution Comments: able to perform SLR Required Braces or Orthoses: Knee Immobilizer - Left Knee Immobilizer - Left: Discontinue once straight leg raise with < 10 degree lag Restrictions Other Position/Activity Restrictions: WBAT L LE   Pertinent Vitals/Pain No c/o pain    ADL  Toilet Transfer: Supervision/safety Toilet Transfer Method: Sit to Loss adjuster, chartered: Comfort height toilet Tub/Shower Transfer: Min guard Tub/Shower Transfer Method: Therapist, art: Shower seat with back Equipment Used: Rolling walker Transfers/Ambulation Related to ADLs: ambulated to bathroom with min guard.  Pt ambulates quickly and is confident:  cued for side stepping for safety. Pt lifted walker from floor once.   ADL Comments: Pt is able to perform UB adls with set up and LB adls with min A.  Recommended he borrow shower/tub seat if able and to get non-skid surface (mat or appliques) for bottom of tub.  He may be able to borrow 3:1 but doesn't feel he needs this.  Practiced toilet transfer twice with RW for support.      OT Diagnosis:    OT Problem List:   OT Treatment Interventions:     OT Goals(Current goals can be found in the care  plan section)    Visit Information  Last OT Received On: 08/14/13 Assistance Needed: +1 History of Present Illness: s/p L TKR       Prior [place] expects to be discharged to:: Private residence Living Arrangements: Alone Additional Comments: may be able to borrow shower seat from mother (was father's who died).  She also may have a 3:1 but he will be able to get up from commode Prior Function Level of Independence: Independent Communication Communication: No difficulties         Vision/Perception     Cognition  Cognition Arousal/Alertness: Awake/alert Behavior During Therapy: WFL for tasks assessed/performed Overall Cognitive Status: Within Functional Limits for tasks assessed    Extremity/Trunk Assessment Upper Extremity Assessment Upper Extremity Assessment: Overall WFL for tasks assessed     Mobility Bed Mobility Bed Mobility: Supine to Sit Supine to Sit: 5: Supervision Details for Bed Mobility Assistance: verbal cues for technique Transfers Sit to Stand: 5: Supervision;With upper extremity assist;From bed Stand to Sit: 5: Supervision Details for Transfer Assistance: pt verbalized safe technique and then performed, left standing to use urinal at bedside with RN     Exercise     Balance     End of Session OT - End of Session Activity Tolerance: Patient tolerated treatment well Patient left: in bed;with call bell/phone within reach  [place] 08/14/2013, 4:47 PM Lesle Chris, OTR/L [phone] 08/14/2013

## 2013-08-14 NOTE — Progress Notes (Signed)
Physical Therapy Treatment Note   08/14/13 1500  PT Visit Information  Last PT Received On 08/14/13  Assistance Needed +1  History of Present Illness s/p L TKR  PT Time Calculation  PT Start Time 1511  PT Stop Time 1520  PT Time Calculation (min) 9 min  Subjective Data  Subjective Pt just finished working with OT and agreeable to ambulate in hallway prior to placement of CPM.  Precautions  Precautions Knee  Precaution Comments able to perform SLR  Restrictions  Other Position/Activity Restrictions WBAT L LE  Cognition  Arousal/Alertness Awake/alert  Behavior During Therapy WFL for tasks assessed/performed  Overall Cognitive Status Within Functional Limits for tasks assessed  Bed Mobility  Bed Mobility Supine to Sit  Supine to Sit 5: Supervision  Details for Bed Mobility Assistance verbal cues for technique  Transfers  Transfers Sit to Stand;Stand to Sit  Sit to Stand 5: Supervision;With upper extremity assist;From bed  Details for Transfer Assistance pt verbalized safe technique and then performed, left standing to use urinal at bedside with RN  Ambulation/Gait  Ambulation/Gait Assistance 5: Supervision  Ambulation Distance (Feet) 220 Feet  Assistive device Rolling walker  Ambulation/Gait Assistance Details encouraged pt to utilize [place] through [facility] to increase distance as pt eager to continue however reported soreness from this morning sessio  Gait Pattern Step-through pattern;Decreased step length - left;Decreased dorsiflexion - left  General Gait Details verbal cues for step length, heel strike, RW distance  PT - End of Session  Activity Tolerance Patient tolerated treatment well  Patient left with nursing/sitter in room (standing at bedside with RN to use urinal)  PT - Assessment/Plan  PT Plan Current plan remains appropriate  PT Frequency 7X/week  Follow Up Recommendations Home health PT  PT equipment Rolling walker with 5" wheels  PT Goal Progression  Progress  towards PT goals Progressing toward goals  PT General Charges  $$ ACUTE PT VISIT 1 Procedure  PT Treatments  $Gait Training 8-22 mins   Carmelia Bake, PT, DPT 08/14/2013 Pager: [phone]

## 2013-08-14 NOTE — Evaluation (Signed)
Physical Therapy Evaluation Patient Details Name: Vincent Gaines MRN: [id number] DOB: 08-Dec-1945 Today's Date: 08/14/2013 Time: 0626-9485 PT Time Calculation (min): 28 min  PT Assessment / Plan / Recommendation History of Present Illness  s/p L TKR  Clinical Impression  Pt is s/p L TKA resulting in the deficits listed below (see PT Problem List).  Pt will benefit from skilled PT to increase their independence and safety with mobility to allow discharge to the venue listed below.  Pt mobilizing well POD #1 and plans to d/c home with daughter to assist tomorrow.     PT Assessment  Patient needs continued PT services    Follow Up Recommendations  Home health PT    Does the patient have the potential to tolerate intense rehabilitation      Barriers to Discharge        Equipment Recommendations  Rolling walker with 5" wheels    Recommendations for Other Services     Frequency 7X/week    Precautions / Restrictions Precautions Precautions: Knee Precaution Comments: able to perform SLR Required Braces or Orthoses: Knee Immobilizer - Left Knee Immobilizer - Left: Discontinue once straight leg raise with < 10 degree lag Restrictions Other Position/Activity Restrictions: WBAT L LE   Pertinent Vitals/Pain Pt reports very little pain, premedicated, ice packs applied     Mobility  Bed Mobility Bed Mobility: Supine to Sit Supine to Sit: 5: Supervision Details for Bed Mobility Assistance: verbal cues for technique Transfers Transfers: Sit to Stand;Stand to Sit Sit to Stand: 4: Min guard;From bed;With upper extremity assist Stand to Sit: 4: Min guard;To chair/3-in-1;With upper extremity assist Details for Transfer Assistance: verbal cues for safe technique Ambulation/Gait Ambulation/Gait Assistance: 4: Min guard Ambulation Distance (Feet): 140 Feet Assistive device: Rolling walker Ambulation/Gait Assistance Details: verbal cues for sequence, technique, RW distance Gait  Pattern: Step-to pattern;Decreased hip/knee flexion - left;Antalgic Gait velocity: decr    Exercises Total Joint Exercises Ankle Circles/Pumps: AROM;Both;15 reps Quad Sets: AROM;Both;20 reps Short Arc Quad: AROM;Left;15 reps Heel Slides: AROM;Left;15 reps;Seated Hip ABduction/ADduction: AROM;Left;15 reps Straight Leg Raises: AROM;Left;10 reps   PT Diagnosis: Difficulty walking  PT Problem List: Decreased strength;Decreased range of motion;Decreased mobility;Decreased knowledge of precautions;Decreased knowledge of use of DME PT Treatment Interventions: Functional mobility training;Stair training;DME instruction;Gait training;Patient/family education;Therapeutic exercise;Therapeutic activities     PT Goals(Current goals can be found in the care plan section) Acute Rehab PT Goals PT Goal Formulation: With patient Time For Goal Achievement: 08/18/13 Potential to Achieve Goals: Good  Visit Information  Last PT Received On: 08/14/13 Assistance Needed: +1 History of Present Illness: s/p L TKR       Prior Functioning  Home Living Family/patient expects to be discharged to:: Private residence Living Arrangements: Alone Available Help at Discharge: Family;Available 24 hours/day (daughter) Type of Home: House Home Access: Stairs to enter [employer] of Steps: 1 Entrance Stairs-Rails: None Home Layout: One level Home Equipment: Walker - standard Prior Function Level of Independence: Independent Communication Communication: No difficulties    Cognition  Cognition Arousal/Alertness: Awake/alert Behavior During Therapy: WFL for tasks assessed/performed Overall Cognitive Status: Within Functional Limits for tasks assessed    Extremity/Trunk Assessment Lower Extremity Assessment Lower Extremity Assessment: LLE deficits/detail LLE Deficits / Details: good quad contraction, able to perform SLR, L knee AROM -8-100*    Balance    End of Session PT - End of  Session Activity Tolerance: Patient tolerated treatment well Patient left: in chair;with call bell/phone within reach CPM Left Knee CPM Left  Knee: Off  GP     Cuma Polyakov,KATHrine E 08/14/2013, 11:22 AM Carmelia Bake, PT, DPT 08/14/2013 Pager: [phone]

## 2013-08-15 LAB — BASIC METABOLIC PANEL
BUN: 13 mg/dL (ref 6–23)
CALCIUM: 8.5 mg/dL (ref 8.4–10.5)
CO2: 24 mEq/L (ref 19–32)
CREATININE: 0.84 mg/dL (ref 0.50–1.35)
Chloride: 106 mEq/L (ref 96–112)
GFR calc Af Amer: >90 mL/min (ref >90)
GFR calc non Af Amer: 89 mL/min — ABNORMAL LOW (ref >90)
Glucose, Bld: 125 mg/dL — ABNORMAL HIGH (ref 70–99)
Potassium: 4.3 mEq/L (ref 3.7–5.3)
Sodium: 139 mEq/L (ref 137–147)

## 2013-08-15 LAB — CBC
HEMATOCRIT: 28.7 % — AB (ref 39.0–52.0)
HEMOGLOBIN: 9.9 g/dL — AB (ref 13.0–17.0)
MCH: 32 pg (ref 26.0–34.0)
MCHC: 34.5 g/dL (ref 30.0–36.0)
MCV: 92.9 fL (ref 78.0–100.0)
Platelets: 213 10*3/uL (ref 150–400)
RBC: 3.09 MIL/uL — AB (ref 4.22–5.81)
RDW: 13.9 % (ref 11.5–15.5)
WBC: 20.5 10*3/uL — ABNORMAL HIGH (ref 4.0–10.5)

## 2013-08-15 MED ORDER — RIVAROXABAN 10 MG PO TABS
10.0000 mg | ORAL_TABLET | Freq: Every day | ORAL | Status: DC
Start: 2013-08-15 — End: 2014-08-14

## 2013-08-15 MED ORDER — TRAMADOL HCL 50 MG PO TABS: 50.0000 mg | ORAL_TABLET | Freq: Four times a day (QID) | ORAL | Status: DC | PRN

## 2013-08-15 MED ORDER — OXYCODONE HCL 5 MG PO TABS: 5.0000 mg | ORAL_TABLET | ORAL | Status: DC | PRN

## 2013-08-15 MED ORDER — METHOCARBAMOL 500 MG PO TABS: 500.0000 mg | ORAL_TABLET | Freq: Four times a day (QID) | ORAL | Status: DC | PRN

## 2013-08-15 NOTE — Progress Notes (Signed)
Physical Therapy Treatment Note   08/15/13 1200  PT Visit Information  Last PT Received On 08/15/13  Assistance Needed +1  History of Present Illness s/p L TKR  PT Time Calculation  PT Start Time 0925  PT Stop Time 0958  PT Time Calculation (min) 33 min  Subjective Data  Subjective Pt ambulated in hallway, practiced stairs, and performed LE exercises.  Pt feels comfortable with d/c home today and had no further questions/concerns.  Precautions  Precautions Knee  Precaution Comments able to perform SLR  Restrictions  Other Position/Activity Restrictions WBAT L LE  Cognition  Arousal/Alertness Awake/alert  Behavior During Therapy WFL for tasks assessed/performed  Overall Cognitive Status Within Functional Limits for tasks assessed  Bed Mobility  Overal bed mobility Modified Independent  Transfers  Overall transfer level Needs assistance  Equipment used Rolling walker (2 wheeled)  Transfers Sit to/from Stand  Sit to Stand Supervision  General transfer comment verbal cues for safety  Ambulation/Gait  Ambulation/Gait assistance Supervision  Ambulation Distance (Feet) 300 Feet  Assistive device Rolling walker (2 wheeled)  Gait Pattern/deviations Step-through pattern  Gait velocity decr  General Gait Details pt focused on appropriate gait pattern - heel strike, knee flexion, posture as he states he doesn't want to look like he is limping  Stairs Yes  Stairs assistance Min guard  Stair Management Step to pattern;One rail Right;Forwards  Number of Stairs 4  General stair comments pt educated on safe stair technique with one rail forwards (back portch) and also one step (front porch), educated on safe sequence  Exercises  Exercises Total Joint  Total Joint Exercises  Ankle Circles/Pumps AROM;Both;15 reps  Quad Sets AROM;Both;20 reps  Short Arc Quad AROM;Left;15 reps  Heel Slides AROM;Left;15 reps;Seated  Hip ABduction/ADduction AROM;Left;15 reps  Straight Leg Raises  AROM;Left;10 reps  Goniometric ROM L knee flexion AROM -4-100*  PT - End of Session  Activity Tolerance Patient tolerated treatment well  Patient left in chair;with call bell/phone within reach  PT - Assessment/Plan  PT Plan Current plan remains appropriate  PT Frequency 7X/week  Follow Up Recommendations Home health PT  PT equipment Rolling walker with 5" wheels  PT Goal Progression  Progress towards PT goals Progressing toward goals  PT General Charges  $$ ACUTE PT VISIT 1 Procedure  PT Treatments  $Gait Training 8-22 mins  $Therapeutic Exercise 8-22 mins   Carmelia Bake, PT, DPT 08/15/2013 Pager: [phone]

## 2013-08-15 NOTE — Discharge Instructions (Signed)
° °Dr. Frank Aluisio °Total Joint Specialist °[facility] °[address] °[place], [place] [address] °[phone] ° °TOTAL KNEE REPLACEMENT POSTOPERATIVE DIRECTIONS ° ° ° °Knee Rehabilitation, Guidelines Following Surgery  °Results after knee surgery are often greatly improved when you follow the exercise, range of motion and muscle strengthening exercises prescribed by your doctor. Safety measures are also important to protect the knee from further injury. Any time any of these exercises cause you to have increased pain or swelling in your knee joint, decrease the amount until you are comfortable again and slowly increase them. If you have problems or questions, call your caregiver or physical therapist for advice.  ° °HOME CARE INSTRUCTIONS  °Remove items at home which could result in a fall. This includes throw rugs or furniture in walking pathways.  °Continue medications as instructed at time of discharge. °You may have some home medications which will be placed on hold until you complete the course of blood thinner medication.  °You may start showering once you are discharged home but do not submerge the incision under water. Just pat the incision dry and apply a dry gauze dressing on daily. °Walk with walker as instructed.  °You may resume a sexual relationship in one month or when given the OK by  your doctor.  °· Use walker as long as suggested by your caregivers. °· Avoid periods of inactivity such as sitting longer than an hour when not asleep. This helps prevent blood clots.  °You may put full weight on your legs and walk as much as is comfortable.  °You may return to work once you are cleared by your doctor.  °Do not drive a car for 6 weeks or until released by you surgeon.  °· Do not drive while taking narcotics.  °Wear the elastic stockings for three weeks following surgery during the day but you may remove then at night. °Make sure you keep all of your appointments after your  operation with all of your doctors and caregivers. You should call the office at the above phone number and make an appointment for approximately two weeks after the date of your surgery. °Change the dressing daily and reapply a dry dressing each time. °Please pick up a stool softener and laxative for home use as long as you are requiring pain medications. °· Continue to use ice on the knee for pain and swelling from surgery. You may notice swelling that will progress down to the foot and ankle.  This is normal after surgery.  Elevate the leg when you are not up walking on it.   °It is important for you to complete the blood thinner medication as prescribed by your doctor. °· Continue to use the breathing machine which will help keep your temperature down.  It is common for your temperature to cycle up and down following surgery, especially at night when you are not up moving around and exerting yourself.  The breathing machine keeps your lungs expanded and your temperature down. ° °RANGE OF MOTION AND STRENGTHENING EXERCISES  °Rehabilitation of the knee is important following a knee injury or an operation. After just a few days of immobilization, the muscles of the thigh which control the knee become weakened and shrink (atrophy). Knee exercises are designed to build up the tone and strength of the thigh muscles and to improve knee motion. Often times heat used for twenty to thirty minutes before working out will loosen up your tissues and help with improving the   range of motion but do not use heat for the first two weeks following surgery. These exercises can be done on a training (exercise) mat, on the floor, on a table or on a bed. Use what ever works the best and is most comfortable for you Knee exercises include:  °Leg Lifts - While your knee is still immobilized in a splint or cast, you can do straight leg raises. Lift the leg to 60 degrees, hold for 3 sec, and slowly lower the leg. Repeat 10-20 times 2-3  times daily. Perform this exercise against resistance later as your knee gets better.  °Quad and Hamstring Sets - Tighten up the muscle on the front of the thigh (Quad) and hold for 5-10 sec. Repeat this 10-20 times hourly. Hamstring sets are done by pushing the foot backward against an object and holding for 5-10 sec. Repeat as with quad sets.  °A rehabilitation program following serious knee injuries can speed recovery and prevent re-injury in the future due to weakened muscles. Contact your doctor or a physical therapist for more information on knee rehabilitation.  ° °SKILLED REHAB INSTRUCTIONS: °If the patient is transferred to a skilled rehab facility following release from the hospital, a list of the current medications will be sent to the facility for the patient to continue.  When discharged from the skilled rehab facility, please have the facility set up the patient's Home Health Physical Therapy prior to being released. Also, the skilled facility will be responsible for providing the patient with their medications at time of release from the facility to include their pain medication, the muscle relaxants, and their blood thinner medication. If the patient is still at the rehab facility at time of the two week follow up appointment, the skilled rehab facility will also need to assist the patient in arranging follow up appointment in our office and any transportation needs. ° °MAKE SURE YOU:  °Understand these instructions.  °Will watch your condition.  °Will get help right away if you are not doing well or get worse.  ° ° °Pick up stool softner and laxative for home. °Do not submerge incision under water. °May shower. °Continue to use ice for pain and swelling from surgery. ° °Take Xarelto for two and a half more weeks, then discontinue Xarelto. ° ° ° ° ° ° ° ° ° °

## 2013-08-15 NOTE — Plan of Care (Signed)
Problem: Consults Goal: Diagnosis- Total Joint Replacement Outcome: Completed/Met Date Met:  08/15/13 Primary Total Knee LEFT

## 2013-08-15 NOTE — Progress Notes (Signed)
   Subjective: 2 Days Post-Op Procedure(s) (LRB): LEFT TOTAL KNEE ARTHROPLASTY (Left) Patient reports pain as mild.   Patient seen in rounds with Dr. Wynelle Link. Patient is well, and has had no acute complaints or problems Patient is ready to go home  Objective: Vital signs in last 24 hours: Temp:  [97.3 F (36.3 C)-98.1 F (36.7 C)] 97.7 F (36.5 C) (01/07 0618) Pulse Rate:  [58-73] 58 (01/07 0618) Resp:  [16-17] 17 (01/07 0618) BP: (108-112)/(56-67) 108/64 mmHg (01/07 0618) SpO2:  [96 %-99 %] 96 % (01/07 0618)  Intake/Output from previous day:  Intake/Output Summary (Last 24 hours) at 08/15/13 0700 Last data filed at 08/15/13 4782  Gross per 24 hour  Intake 2510.83 ml  Output   2125 ml  Net 385.83 ml    Intake/Output this shift: Total I/O In: 840 [P.O.:840] Out: 400 [Urine:400]  Labs:  Recent Labs  08/14/13 0541 08/15/13 0620  HGB 10.6* 9.9*    Recent Labs  08/14/13 0541 08/15/13 0620  WBC 16.3* 20.5*  RBC 3.33* 3.09*  HCT 31.1* 28.7*  PLT 227 213    Recent Labs  08/14/13 0541  NA 136*  K 4.1  CL 102  CO2 24  BUN 14  CREATININE 0.88  GLUCOSE 160*  CALCIUM 8.3*   No results found for this basename: LABPT, INR,  in the last 72 hours  EXAM: General - Patient is Alert and Appropriate Extremity - Neurovascular intact Sensation intact distally Dorsiflexion/Plantar flexion intact Incision - clean, dry, no drainage, healing Motor Function - intact, moving foot and toes well on exam.   Assessment/Plan: 2 Days Post-Op Procedure(s) (LRB): LEFT TOTAL KNEE ARTHROPLASTY (Left) Procedure(s) (LRB): LEFT TOTAL KNEE ARTHROPLASTY (Left) Past Medical History  Diagnosis Date  . 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 Bronchitis 08-06-13    past bronchitis- phlegm issue in mornings. Uses E-cigarettes  . Anxiety   . Arthritis     osteoarthritis-knees  . Fractures     hx. rib fx.  . H/O urinary  frequency     x2 nights  . Bradycardia     hx. low pulse rate  . Hypercholesteremia    Active Problems:   Osteoarthritis of left knee   OA (osteoarthritis) of knee   Postoperative anemia due to acute blood loss   Hyponatremia  Estimated body mass index is 25.48 kg/(m^2) as calculated from the following:   Height as of this encounter: 5\' 8"  (1.727 m).   Weight as of this encounter: 76 kg (167 lb 8.8 oz). Up with therapy Discharge home with home health Diet - Regular diet Follow up - in 2 weeks Activity - WBAT Disposition - Home Condition Upon Discharge - Good D/C Meds - See DC Summary DVT Prophylaxis - Xarelto  Vincent Gaines 08/15/2013, 7:00 AM

## 2013-08-15 NOTE — Progress Notes (Signed)
Discharge summary sent to payer through [facility]  

## 2013-08-15 NOTE — Discharge Summary (Signed)
Physician Discharge Summary   Patient ID: Vincent Gaines MRN: [id number] DOB/AGE: 1945-12-15 68 y.o.  Admit date: 08/13/2013 Discharge date: 08/15/2013  Primary Diagnosis:  Osteoarthritis Left knee(s)  Admission Diagnoses:  Past Medical History  Diagnosis Date  . 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 Bronchitis 08-06-13    past bronchitis- phlegm issue in mornings. Uses E-cigarettes  . Anxiety   . Arthritis     osteoarthritis-knees  . Fractures     hx. rib fx.  . H/O urinary frequency     x2 nights  . Bradycardia     hx. low pulse rate  . Hypercholesteremia    Discharge Diagnoses:   Active Problems:   Osteoarthritis of left knee   OA (osteoarthritis) of knee   Postoperative anemia due to acute blood loss   Hyponatremia  Estimated body mass index is 25.48 kg/(m^2) as calculated from the following:   Height as of this encounter: _0  (1.727 m).   Weight as of this encounter: 76 kg (167 lb 8.8 oz).  Procedure:  Procedure(s) (LRB): LEFT TOTAL KNEE ARTHROPLASTY (Left)   Consults: None  HPI: Vincent Gaines is a 68 y.o. year old male with end stage OA of his left knee with progressively worsening pain and dysfunction. He has constant pain, with activity and at rest and significant functional deficits with difficulties even with ADLs. He has had extensive non-op management including analgesics, injections of cortisone and viscosupplements, and home exercise program, but remains in significant pain with significant dysfunction. Radiographs show bone on bone arthritis medial and patellofemoral with significant varus deformity. He presents now for left Total Knee Arthroplasty.   Laboratory Data: Admission on 08/13/2013, Discharged on 08/15/2013  Component Date Value Range Status  . ABO/RH(D) 08/13/2013 B POS   Final  . Antibody Screen 08/13/2013 NEG   Final  . Sample Expiration 08/13/2013 08/16/2013   Final  .  ABO/RH(D) 08/13/2013 B POS   Final  . WBC 08/14/2013 16.3* 4.0 - 10.5 K/uL Final  . RBC 08/14/2013 3.33* 4.22 - 5.81 MIL/uL Final  . Hemoglobin 08/14/2013 10.6* 13.0 - 17.0 g/dL Final  . HCT 08/14/2013 31.1* 39.0 - 52.0 % Final  . MCV 08/14/2013 93.4  78.0 - 100.0 fL Final  . MCH 08/14/2013 31.8  26.0 - 34.0 pg Final  . MCHC 08/14/2013 34.1  30.0 - 36.0 g/dL Final  . RDW 08/14/2013 13.6  11.5 - 15.5 % Final  . Platelets 08/14/2013 227  150 - 400 K/uL Final  . Sodium 08/14/2013 136* 137 - 147 mEq/L Final  . Potassium 08/14/2013 4.1  3.7 - 5.3 mEq/L Final  . Chloride 08/14/2013 102  96 - 112 mEq/L Final  . CO2 08/14/2013 24  19 - 32 mEq/L Final  . Glucose, Bld 08/14/2013 160* 70 - 99 mg/dL Final  . BUN 08/14/2013 14  6 - 23 mg/dL Final  . Creatinine, Ser 08/14/2013 0.88  0.50 - 1.35 mg/dL Final  . Calcium 08/14/2013 8.3* 8.4 - 10.5 mg/dL Final  . GFR calc non Af Amer 08/14/2013 87* >90 mL/min Final  . GFR calc Af Amer 08/14/2013 >90  >90 mL/min Final   Comment: (NOTE)                          The eGFR has been calculated using the CKD EPI equation.  This calculation has not been validated in all clinical situations.                          eGFR's persistently <90 mL/min signify possible Chronic Kidney                          Disease.  . WBC 08/15/2013 20.5* 4.0 - 10.5 K/uL Final  . RBC 08/15/2013 3.09* 4.22 - 5.81 MIL/uL Final  . Hemoglobin 08/15/2013 9.9* 13.0 - 17.0 g/dL Final  . HCT 08/15/2013 28.7* 39.0 - 52.0 % Final  . MCV 08/15/2013 92.9  78.0 - 100.0 fL Final  . MCH 08/15/2013 32.0  26.0 - 34.0 pg Final  . MCHC 08/15/2013 34.5  30.0 - 36.0 g/dL Final  . RDW 08/15/2013 13.9  11.5 - 15.5 % Final  . Platelets 08/15/2013 213  150 - 400 K/uL Final  . Sodium 08/15/2013 139  137 - 147 mEq/L Final  . Potassium 08/15/2013 4.3  3.7 - 5.3 mEq/L Final  . Chloride 08/15/2013 106  96 - 112 mEq/L Final  . CO2 08/15/2013 24  19 - 32 mEq/L Final  . Glucose, Bld  08/15/2013 125* 70 - 99 mg/dL Final  . BUN 08/15/2013 13  6 - 23 mg/dL Final  . Creatinine, Ser 08/15/2013 0.84  0.50 - 1.35 mg/dL Final  . Calcium 08/15/2013 8.5  8.4 - 10.5 mg/dL Final  . GFR calc non Af Amer 08/15/2013 89* >90 mL/min Final  . GFR calc Af Amer 08/15/2013 >90  >90 mL/min Final   Comment: (NOTE)                          The eGFR has been calculated using the CKD EPI equation.                          This calculation has not been validated in all clinical situations.                          eGFR's persistently <90 mL/min signify possible Chronic Kidney                          Disease.  Hospital Outpatient Visit on 08/06/2013  Component Date Value Range Status  . MRSA, PCR 08/06/2013 NEGATIVE  NEGATIVE Final  . Staphylococcus aureus 08/06/2013 NEGATIVE  NEGATIVE Final   Comment:                                 The Xpert SA Assay (FDA                          approved for NASAL specimens                          in patients over 56 years of age),                          is one component of                          a comprehensive surveillance  program.  Test performance has                          been validated by [facility] for patients greater                          than or equal to 29 year old.                          It is not intended                          to diagnose infection nor to                          guide or monitor treatment.  Marland Kitchen aPTT 08/06/2013 30  24 - 37 seconds Final  . WBC 08/06/2013 7.1  4.0 - 10.5 K/uL Final  . RBC 08/06/2013 4.16* 4.22 - 5.81 MIL/uL Final  . Hemoglobin 08/06/2013 13.4  13.0 - 17.0 g/dL Final  . HCT 08/06/2013 39.3  39.0 - 52.0 % Final  . MCV 08/06/2013 94.5  78.0 - 100.0 fL Final  . MCH 08/06/2013 32.2  26.0 - 34.0 pg Final  . MCHC 08/06/2013 34.1  30.0 - 36.0 g/dL Final  . RDW 08/06/2013 13.8  11.5 - 15.5 % Final  . Platelets 08/06/2013 248  150 - 400 K/uL Final    . Sodium 08/06/2013 137  135 - 145 mEq/L Final  . Potassium 08/06/2013 4.3  3.5 - 5.1 mEq/L Final  . Chloride 08/06/2013 102  96 - 112 mEq/L Final  . CO2 08/06/2013 28  19 - 32 mEq/L Final  . Glucose, Bld 08/06/2013 81  70 - 99 mg/dL Final  . BUN 08/06/2013 12  6 - 23 mg/dL Final  . Creatinine, Ser 08/06/2013 0.98  0.50 - 1.35 mg/dL Final  . Calcium 08/06/2013 9.6  8.4 - 10.5 mg/dL Final  . Total Protein 08/06/2013 7.1  6.0 - 8.3 g/dL Final  . Albumin 08/06/2013 4.0  3.5 - 5.2 g/dL Final  . AST 08/06/2013 17  0 - 37 U/L Final  . ALT 08/06/2013 10  0 - 53 U/L Final  . Alkaline Phosphatase 08/06/2013 52  39 - 117 U/L Final  . Total Bilirubin 08/06/2013 0.4  0.3 - 1.2 mg/dL Final  . GFR calc non Af Amer 08/06/2013 83* >90 mL/min Final  . GFR calc Af Amer 08/06/2013 >90  >90 mL/min Final   Comment: (NOTE)                          The eGFR has been calculated using the CKD EPI equation.                          This calculation has not been validated in all clinical situations.                          eGFR's persistently <90 mL/min signify possible Chronic Kidney  Disease.  Marland Kitchen Prothrombin Time 08/06/2013 13.1  11.6 - 15.2 seconds Final  . INR 08/06/2013 1.01  0.00 - 1.49 Final  . Color, Urine 08/06/2013 YELLOW  YELLOW Final  . APPearance 08/06/2013 CLEAR  CLEAR Final  . Specific Gravity, Urine 08/06/2013 1.005  1.005 - 1.030 Final  . pH 08/06/2013 7.0  5.0 - 8.0 Final  . Glucose, UA 08/06/2013 NEGATIVE  NEGATIVE mg/dL Final  . Hgb urine dipstick 08/06/2013 NEGATIVE  NEGATIVE Final  . Bilirubin Urine 08/06/2013 NEGATIVE  NEGATIVE Final  . Ketones, ur 08/06/2013 NEGATIVE  NEGATIVE mg/dL Final  . Protein, ur 08/06/2013 NEGATIVE  NEGATIVE mg/dL Final  . Urobilinogen, UA 08/06/2013 0.2  0.0 - 1.0 mg/dL Final  . Nitrite 08/06/2013 NEGATIVE  NEGATIVE Final  . Leukocytes, UA 08/06/2013 NEGATIVE  NEGATIVE Final   MICROSCOPIC NOT DONE ON URINES WITH NEGATIVE PROTEIN,  BLOOD, LEUKOCYTES, NITRITE, OR GLUCOSE <1000 mg/dL.     X-Rays:No results found.  EKG: Orders placed during the hospital encounter of 08/06/13  . EKG 12-LEAD  . EKG 12-LEAD  . EKG     Hospital Course: Vincent Gaines is a 68 y.o. who was admitted to [facility]. They were brought to the operating room on 08/13/2013 and underwent Procedure(s): LEFT TOTAL KNEE ARTHROPLASTY.  Patient tolerated the procedure well and was later transferred to the recovery room and then to the orthopaedic floor for postoperative care.  They were given PO and IV analgesics for pain control following their surgery.  They were given 24 hours of postoperative antibiotics of  Anti-infectives   Start     Dose/Rate Route Frequency Ordered Stop   08/13/13 2000  ceFAZolin (ANCEF) IVPB 2 g/50 mL premix     2 g 100 mL/hr over 30 Minutes Intravenous Every 6 hours 08/13/13 1550 08/14/13 0220   08/13/13 0930  ceFAZolin (ANCEF) IVPB 2 g/50 mL premix     2 g 100 mL/hr over 30 Minutes Intravenous On call to O.R. 08/13/13 4270 08/13/13 1249     and started on DVT prophylaxis in the form of Xarelto.   PT and OT were ordered for total joint protocol.  Discharge planning consulted to help with postop disposition and equipment needs.  Patient had a good night on the evening of surgery.  They started to get up OOB with therapy on day one. Hemovac drain was pulled without difficulty.  Continued to work with therapy into day two.  Dressing was changed on day two and the incision was healing well. Patient was seen in rounds and was ready to go home later on day two.   Discharge Medications: Prior to Admission medications   Medication Sig Start Date End Date Taking? Authorizing Provider  ALPRAZolam Duanne Moron) 0.5 MG tablet Take 0.5 mg by mouth 3 (three) times daily as needed for anxiety.   Yes Historical Provider, MD  meloxicam (MOBIC) 7.5 MG tablet Take 7.5 mg by mouth daily.   Yes Historical Provider, MD  Polyvinyl Alcohol  (LIQUID TEARS OP) Apply 1 drop to eye daily with breakfast.   Yes Historical Provider, MD  simvastatin (ZOCOR) 40 MG tablet Take 40 mg by mouth daily with breakfast.   Yes Historical Provider, MD  methocarbamol (ROBAXIN) 500 MG tablet Take 1 tablet (500 mg total) by mouth every 6 (six) hours as needed for muscle spasms. 08/15/13   Alexzandrew Perkins, PA-C  oxyCODONE (OXY IR/ROXICODONE) 5 MG immediate release tablet Take 1-2 tablets (5-10 mg total) by mouth every 3 (  three) hours as needed for breakthrough pain. 08/15/13   Alexzandrew Dara Lords, PA-C  rivaroxaban (XARELTO) 10 MG TABS tablet Take 1 tablet (10 mg total) by mouth daily with breakfast. Take Xarelto for two and a half more weeks, then discontinue Xarelto. Once the patient has completed the blood thinner regimen, then take a Baby 81 mg Aspirin daily for four more weeks. 08/15/13   Alexzandrew Perkins, PA-C  traMADol (ULTRAM) 50 MG tablet Take 1-2 tablets (50-100 mg total) by mouth every 6 (six) hours as needed (mild pain). 08/15/13   Alexzandrew Dara Lords, PA-C   Discharge home with home health  Diet - Regular diet  Follow up - in 2 weeks  Activity - WBAT  Disposition - Home  Condition Upon Discharge - Good  D/C Meds - See DC Summary  DVT Prophylaxis - Xarelto       Discharge Orders   Future Orders Complete By Expires   Call MD / Call 911  As directed    Comments:     If you experience chest pain or shortness of breath, CALL 911 and be transported to the hospital emergency room.  If you develope a fever above 101 F, pus (white drainage) or increased drainage or redness at the wound, or calf pain, call your surgeon's office.   Change dressing  As directed    Comments:     Change dressing daily with sterile 4 x 4 inch gauze dressing and apply TED hose. Do not submerge the incision under water.   Constipation Prevention  As directed    Comments:     Drink plenty of fluids.  Prune juice may be helpful.  You may use a stool softener, such as  Colace (over the counter) 100 mg twice a day.  Use MiraLax (over the counter) for constipation as needed.   Diet - low sodium heart healthy  As directed    Discharge instructions  As directed    Comments:     Pick up stool softner and laxative for home. Do not submerge incision under water. May shower. Continue to use ice for pain and swelling from surgery.  Take Xarelto for two and a half more weeks, then discontinue Xarelto. Once the patient has completed the blood thinner regimen, then take a Baby 81 mg Aspirin daily for four more weeks.   Do not put a pillow under the knee. Place it under the heel.  As directed    Do not sit on low chairs, stoools or toilet seats, as it may be difficult to get up from low surfaces  As directed    Driving restrictions  As directed    Comments:     No driving until released by the physician.   Increase activity slowly as tolerated  As directed    Lifting restrictions  As directed    Comments:     No lifting until released by the physician.   Patient may shower  As directed    Comments:     You may shower without a dressing once there is no drainage.  Do not wash over the wound.  If drainage remains, do not shower until drainage stops.   TED hose  As directed    Comments:     Use stockings (TED hose) for 3 weeks on both leg(s).  You may remove them at night for sleeping.   Weight bearing as tolerated  As directed    Questions:     Laterality:  Extremity:         Medication List    STOP taking these medications       meloxicam 7.5 MG tablet  Commonly known as:  MOBIC      TAKE these medications       ALPRAZolam 0.5 MG tablet  Commonly known as:  XANAX  Take 0.5 mg by mouth 3 (three) times daily as needed for anxiety.     LIQUID TEARS OP  Apply 1 drop to eye daily with breakfast.     methocarbamol 500 MG tablet  Commonly known as:  ROBAXIN  Take 1 tablet (500 mg total) by mouth every 6 (six) hours as needed for muscle spasms.       oxyCODONE 5 MG immediate release tablet  Commonly known as:  Oxy IR/ROXICODONE  Take 1-2 tablets (5-10 mg total) by mouth every 3 (three) hours as needed for breakthrough pain.     rivaroxaban 10 MG Tabs tablet  Commonly known as:  XARELTO  - Take 1 tablet (10 mg total) by mouth daily with breakfast. Take Xarelto for two and a half more weeks, then discontinue Xarelto.  - Once the patient has completed the blood thinner regimen, then take a Baby 81 mg Aspirin daily for four more weeks.     simvastatin 40 MG tablet  Commonly known as:  ZOCOR  Take 40 mg by mouth daily with breakfast.     traMADol 50 MG tablet  Commonly known as:  ULTRAM  Take 1-2 tablets (50-100 mg total) by mouth every 6 (six) hours as needed (mild pain).       Follow-up Information   Follow up with Gearlean Alf, MD. Schedule an appointment as soon as possible for a visit in 2 weeks.   Specialty:  Orthopedic Surgery   Contact information:   [address] [place] [address] [phone]       Signed: Mickel Crow 08/23/2013, 11:02 AM

## 2013-09-03 ENCOUNTER — Ambulatory Visit (HOSPITAL_COMMUNITY)
Admission: RE | Admit: 2013-09-03 | Discharge: 2013-09-03 | Disposition: A | Discharge status: Home / Self Care | Payer: Medicare Other | Source: Ambulatory Visit | Attending: Orthopedic Surgery | Admitting: Orthopedic Surgery

## 2013-09-03 DIAGNOSIS — IMO0001 Reserved for inherently not codable concepts without codable children: Secondary | ICD-10-CM | POA: Insufficient documentation

## 2013-09-03 DIAGNOSIS — M25669 Stiffness of unspecified knee, not elsewhere classified: Secondary | ICD-10-CM | POA: Insufficient documentation

## 2013-09-03 DIAGNOSIS — M25469 Effusion, unspecified knee: Secondary | ICD-10-CM | POA: Insufficient documentation

## 2013-09-03 DIAGNOSIS — M25569 Pain in unspecified knee: Secondary | ICD-10-CM | POA: Insufficient documentation
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
## 2013-09-05 ENCOUNTER — Ambulatory Visit (HOSPITAL_COMMUNITY)
Admission: RE | Admit: 2013-09-05 | Discharge: 2013-09-05 | Disposition: A | Discharge status: Home / Self Care | Payer: Medicare Other | Source: Ambulatory Visit | Attending: Orthopedic Surgery | Admitting: Orthopedic Surgery

## 2013-09-05 NOTE — Progress Notes (Signed)
Physical Therapy Treatment Patient Details  Name: Vincent Gaines MRN: [id number] Date of Birth: 1945/08/31  Today's Date: 09/05/2013 Time: 1023-1103 PT Time Calculation (min): 40 min Charges: Therex x 18'(1023-1041) Manual x 42'(5956-3875) Ice x 10'(1053-1103)  Visit#: 2 of 8  Re-eval: 10/03/13  Authorization: [employer] medicare  Authorization Visit#: 2 of 10   Subjective: Symptoms/Limitations Symptoms: "My knee has just stiffened up." Pain Assessment Currently in Pain?: Yes Pain Score: 8  Pain Location: Knee Pain Orientation: Left  Precautions/Restrictions     Exercise/Treatments Aerobic Stationary Bike: 8' Rocking then full revolutions seat 10 Isokinetic: Biodex passive flexion 94% x 10' Supine Knee Extension: PROM Knee Flexion: PROM   Modalities Modalities: Cryotherapy Manual Therapy Manual Therapy: Joint mobilization Joint Mobilization: Grade II A/P joint mobs to left knee to improve motion/decrease pain Cryotherapy Number Minutes Cryotherapy: 10 Minutes Cryotherapy Location: Knee (Left) Type of Cryotherapy: Ice pack  Physical Therapy Assessment and Plan PT Assessment and Plan Clinical Impression Statement: Treatment focus on improving left knee ROM. Completed recumbent bike to improve ROM. Pt able to make full revolution and rocking for five minutes. Pt requires vc's while on bike to increase stretch. Began biodex at 94% to improve left knee flexion. PROM and joint mobs completed to improve motion. Pt reports pain decrease to 0/10 at end of session. Pt will benefit from skilled therapeutic intervention in order to improve on the following deficits: Decreased activity tolerance;Decreased strength;Difficulty walking;Pain;Decreased range of motion Rehab Potential: Good PT Frequency: Min 2X/week PT Duration: 4 weeks PT Treatment/Interventions: Stair training;Therapeutic activities;Therapeutic exercise;Balance training;Patient/family education PT Plan: Focus on ROM  and balance.  If pt is not progressing as indicated will increase treatment to three times a week.  Please take weekly ROM      Problem List Patient Active Problem List   Diagnosis Date Noted  . Stiffness of joint, not elsewhere classified, lower leg 09/03/2013  . Pain and swelling of knee 09/03/2013  . Postoperative anemia due to acute blood loss 08/14/2013  . Hyponatremia 08/14/2013  . Osteoarthritis of left knee 08/13/2013  . OA (osteoarthritis) of knee 08/13/2013    PT - End of Session Activity Tolerance: Patient tolerated treatment well General Behavior During Therapy: [facility] for tasks assessed/performed  Rachelle Hora, PTA  09/05/2013, 12:36 PM

## 2013-09-10 ENCOUNTER — Ambulatory Visit (HOSPITAL_COMMUNITY): Payer: Medicare Other | Admitting: Physical Therapy

## 2013-09-11 ENCOUNTER — Ambulatory Visit (HOSPITAL_COMMUNITY)
Admission: RE | Admit: 2013-09-11 | Discharge: 2013-09-11 | Disposition: A | Discharge status: Home / Self Care | Payer: Medicare Other | Source: Ambulatory Visit | Attending: Internal Medicine | Admitting: Internal Medicine

## 2013-09-11 DIAGNOSIS — M25469 Effusion, unspecified knee: Secondary | ICD-10-CM | POA: Insufficient documentation

## 2013-09-11 DIAGNOSIS — M25669 Stiffness of unspecified knee, not elsewhere classified: Secondary | ICD-10-CM | POA: Insufficient documentation

## 2013-09-11 DIAGNOSIS — IMO0001 Reserved for inherently not codable concepts without codable children: Secondary | ICD-10-CM | POA: Insufficient documentation

## 2013-09-11 DIAGNOSIS — M25569 Pain in unspecified knee: Secondary | ICD-10-CM | POA: Insufficient documentation

## 2013-09-11 NOTE — Progress Notes (Signed)
Physical Therapy Treatment Patient Details  Name: Vincent Gaines MRN: [id number] Date of Birth: [birth year]  Today's Date: 09/11/2013 Time: 1104-1212 PT Time Calculation (min): 68 min  Visit#: 3 of 8  Re-eval: 10/03/13 Authorization: [employer] medicare  Authorization Visit#: 3 of 10  Charges:  therex [id number] (24'), manual 1130-1155 (25'), icepack [id number] (10')  Subjective: Symptoms/Limitations Symptoms: Pt states he can tell his knee is getting better and loosening up.  States he currently is without pain just stiffness.  Took a pain pill this morning.   Exercise/Treatments Aerobic Stationary Bike: 10' Rocking then full revolutions seat 10 Isokinetic: Biodex passive flexion 95% x 10'   Modalities Modalities: Cryotherapy Manual Therapy Manual Therapy: Other (comment) Joint Mobilization: Grade II A/P joint mobs, MFR to perimeter of LT knee to increase ROM and decrease adhesions Massage: retrograde massage with elevation f/b icepack to decrease swelling. Other Manual Therapy: PROM for knee extension/flexion.  AROM 10-92, PROM 100 degrees flexion Cryotherapy Number Minutes Cryotherapy: 10 Minutes Cryotherapy Location: Knee (Left) Type of Cryotherapy: Ice pack  Physical Therapy Assessment and Plan PT Assessment and Plan Clinical Impression Statement: Continued focus on increasing Lt knee ROM with reducing swelling.  Improving ROM as noted with full rev on bike and able to increase ROM on biodex today to 95% full ROM.  Added retro massage today after MFR/joint mobs to decrease swelling.  Progressing well. PT Plan: Focus on ROM per evaluating therapist.  Take weekly ROM measurements.  Add prone knee hang with manual next visit.     Problem List Patient Active Problem List   Diagnosis Date Noted  . Stiffness of joint, not elsewhere classified, lower leg 09/03/2013  . Pain and swelling of knee 09/03/2013  . Postoperative anemia due to acute blood loss 08/14/2013  .  Hyponatremia 08/14/2013  . Osteoarthritis of left knee 08/13/2013  . OA (osteoarthritis) of knee 08/13/2013    PT - End of Session Activity Tolerance: Patient tolerated treatment well General Behavior During Therapy: WFL for tasks assessed/performed   Teena Irani, PTA/CLT 09/11/2013, 12:12 PM

## 2013-09-13 ENCOUNTER — Ambulatory Visit (HOSPITAL_COMMUNITY)
Admission: RE | Admit: 2013-09-13 | Discharge: 2013-09-13 | Disposition: A | Discharge status: Home / Self Care | Payer: Medicare Other | Source: Ambulatory Visit | Attending: Internal Medicine | Admitting: Internal Medicine

## 2013-09-13 DIAGNOSIS — M25569 Pain in unspecified knee: Secondary | ICD-10-CM

## 2013-09-13 DIAGNOSIS — M25669 Stiffness of unspecified knee, not elsewhere classified: Secondary | ICD-10-CM

## 2013-09-13 DIAGNOSIS — M25469 Effusion, unspecified knee: Secondary | ICD-10-CM

## 2013-09-13 NOTE — Progress Notes (Signed)
Physical Therapy Treatment Patient Details  Name: Vincent Gaines MRN: [id number] Date of Birth: 1946-01-03  Today's Date: 09/13/2013 Time: 1105-1157 PT Time Calculation (min): 52 min Charge there ex 1105-1145; manual 1145-1157 Visit#: 4 of 8  Re-eval: 10/03/13    Authorization: [employer] medicare  Authorization Visit#: 4 of 8   Subjective: Symptoms/Limitations Symptoms: Pt states that he is trying to get back into his natural routine. Everything is getting easier Pain Assessment Currently in Pain?:  (Pt took two pills proior to therapy) Pain Score: 0-No pain Pain Orientation: Left   Exercise/Treatments  Stretches Active Hamstring Stretch: 3 reps;30 seconds Quad Stretch: 3 reps;30 seconds Aerobic Stationary Bike: 8" full revolution   Supine Heel Slides: 10 reps Knee Extension: PROM Knee Flexion: PROM   Prone  Hamstring Curl: 15 reps Contract/Relax to Increase Flexion: 3x   Manual Therapy Massage: retromassage with elevation to decrease swelling Other Manual Therapy: patellar mob; tib-fib mob as well as manual techniques to decrease swelling  Physical Therapy Assessment and Plan PT Assessment and Plan Clinical Impression Statement: Pt flexion continues to imiprove extension remains at 10 which is better than initial eval but the same as last treatment.  Therapist requested pt to attempt prone extension x 30 minutes a day at home to increase extension.  Began PROM PT Plan: Focus on ROM and decreasing swelling.  Once ROM is less than five and flex 115 may begin to work on balance    Goals  progressing   Problem List Patient Active Problem List   Diagnosis Date Noted  . Stiffness of joint, not elsewhere classified, lower leg 09/03/2013  . Pain and swelling of knee 09/03/2013  . Postoperative anemia due to acute blood loss 08/14/2013  . Hyponatremia 08/14/2013  . Osteoarthritis of left knee 08/13/2013  . OA (osteoarthritis) of knee 08/13/2013    PT - End of  Session Activity Tolerance: Patient tolerated treatment well General Behavior During Therapy: WFL for tasks assessed/performed  GP    RUSSELL,CINDY 09/13/2013, 1:17 PM

## 2013-09-17 ENCOUNTER — Ambulatory Visit (HOSPITAL_COMMUNITY)
Admission: RE | Admit: 2013-09-17 | Discharge: 2013-09-17 | Disposition: A | Discharge status: Home / Self Care | Payer: Medicare Other | Source: Ambulatory Visit | Attending: Internal Medicine | Admitting: Internal Medicine

## 2013-09-17 DIAGNOSIS — M25469 Effusion, unspecified knee: Secondary | ICD-10-CM

## 2013-09-17 DIAGNOSIS — M25669 Stiffness of unspecified knee, not elsewhere classified: Secondary | ICD-10-CM

## 2013-09-17 DIAGNOSIS — M25569 Pain in unspecified knee: Secondary | ICD-10-CM

## 2013-09-17 NOTE — Progress Notes (Signed)
Physical Therapy Treatment Patient Details  Name: Vincent Gaines MRN: [id number] Date of Birth: 1946/07/11  Today's Date: 09/17/2013 Time: 1100-1155 PT Time Calculation (min): 55 min Charge:  There ex 1100-1145; IP [id number] Visit#: 5 of 8  Re-eval: 10/03/13    Authorization: [employer] medicare  Authorization Time Period:    Authorization Visit#: 5 of 8   Subjective: Symptoms/Limitations Symptoms: Pt states that he has cut wood over the weekend.  He states that the most difficulty thing he has to do at this time is going down steps, Pain Assessment Currently in Pain?: Yes Pain Score: 2  Pain Location: Knee Pain Orientation: Left Pain Type: Surgical pain    Exercise/Treatments     Stretches Active Hamstring Stretch: 3 reps;30 seconds Quad Stretch: 3 reps;30 seconds Gastroc Stretch: 2 reps;30 seconds Aerobic Stationary Bike: 8" full revolution seat at 7   Seated Long Arc Quad: Strengthening;10 reps;Weights Long Arc Quad Weight: 5 lbs. Supine Quad Sets: 10 reps Heel Slides: 10 reps Terminal Knee Extension: 10 reps Knee Extension: PROM Knee Flexion: PROM    Prone  Hamstring Curl: 10 reps;Limitations Hamstring Curl Limitations: 5# Contract/Relax to Increase Flexion: 3x Prone Knee Hang: 3 minutes;Weights (with manual to medial aspect of hamstring ) Prone Knee Hang Weights (lbs): 2#   Modalities Modalities: Cryotherapy Cryotherapy Number Minutes Cryotherapy: 10 Minutes Cryotherapy Location: Knee Type of Cryotherapy: Ice pack  Physical Therapy Assessment and Plan PT Assessment and Plan Clinical Impression Statement: Pt gained significantly in ROM with continued improvement with functional tasks at home.  Pt recieved PROM to continue gains in ROM PT Plan: May begin balance activities as well     Goals  progressing  Problem List Patient Active Problem List   Diagnosis Date Noted  . Stiffness of joint, not elsewhere classified, lower leg 09/03/2013  . Pain  and swelling of knee 09/03/2013  . Postoperative anemia due to acute blood loss 08/14/2013  . Hyponatremia 08/14/2013  . Osteoarthritis of left knee 08/13/2013  . OA (osteoarthritis) of knee 08/13/2013       GP    Garielle Mroz,CINDY 09/17/2013, 2:58 PM

## 2013-09-19 ENCOUNTER — Ambulatory Visit (HOSPITAL_COMMUNITY)
Admission: RE | Admit: 2013-09-19 | Discharge: 2013-09-19 | Disposition: A | Discharge status: Home / Self Care | Payer: Medicare Other | Source: Ambulatory Visit | Attending: Internal Medicine | Admitting: Internal Medicine

## 2013-09-19 NOTE — Progress Notes (Signed)
Physical Therapy Treatment Patient Details  Name: Vincent Gaines MRN: [id number] Date of Birth: 10-06-1945  Today's Date: 09/19/2013 Time: 1100-1200 PT Time Calculation (min): 60 min Charges: ROM x 1 [phone]) Therex x 33'(0076-2263) Ice x 10'(1150-1112)  Visit#: 6 of 8  Re-eval: 10/03/13  Authorization: [employer] medicare  Authorization Visit#: 6 of 8   Subjective: Symptoms/Limitations Symptoms: Pt states that his knee is tight but reports no pain. Pain Assessment Currently in Pain?: No/denies  Objective: AROM (degrees) Left Knee Extension: 4 (was 10 on 09/11/13) Left Knee Flexion: 110 (was 92 on 09/11/12) Exercise/Treatments Aerobic Stationary Bike: 8' full revolution seat at 7 Standing Heel Raises: 10 reps;Limitations Heel Raises Limitations: Toe raises x 10 Knee Flexion: 10 reps  Supine Quad Sets: 10 reps Heel Slides: 10 reps Terminal Knee Extension: 10 reps Knee Extension: PROM Knee Flexion: PROM  Ice x 10' to left knee to limit pain and inflammation  Physical Therapy Assessment and Plan PT Assessment and Plan Clinical Impression Statement: Pt condensed to progress well overall. Pt displays less compensation for, limited motion on bicycle. Pt has met or is progressing toward all goals. Pt displays significant improvement in ROM. Progress note sent to MD prior to MD appointment. PT Plan: Continue to progress strength and ROM. May progress standing activties as ROM is progressing well.    Goals  PT Short Term Goals PT Short Term Goal 1: Pt ROM to be 5- 95 degrees to allow pt to sit with comfort for 30 minutes to be able to enjoy a meal. Met PT Short Term Goal 2: Pt to be able to stand for 15 minutes to be able to make a small meal ie fry and egg or make a sandwich. Met PT Short Term Goal 3: Pain no greater than a 5/10 for improved comfort and less medication. Met  PT Long Term Goals PT Long Term Goal 1: Pt ROM 2-115 degrees to allow pt to be able to sit for two  hours for traveling or eating a meal at a restaurant. Progressing toward goal PT Long Term Goal 2: Pt to be able to squat to cut wood. Progressing toward goal Long Term Goal 3: Pt to be able to tolerate standing for 30 minutes to cut wood. Met Long Term Goal 4: Pt strength to be increased one grade to be able to go up and down steps reciprocally. Progressing toward goal PT Long Term Goal 5: Pt pain to be no greater than a 2/10 80% of the day to be able to stop taking prescription medication. Progressing toward goal   Problem List Patient Active Problem List   Diagnosis Date Noted  . Stiffness of joint, not elsewhere classified, lower leg 09/03/2013  . Pain and swelling of knee 09/03/2013  . Postoperative anemia due to acute blood loss 08/14/2013  . Hyponatremia 08/14/2013  . Osteoarthritis of left knee 08/13/2013  . OA (osteoarthritis) of knee 08/13/2013    PT - End of Session Activity Tolerance: Patient tolerated treatment well General Behavior During Therapy: [facility] for tasks assessed/performed  Rachelle Hora, PTA 09/19/2013, 6:13 PM

## 2013-09-24 ENCOUNTER — Ambulatory Visit (HOSPITAL_COMMUNITY)
Admission: RE | Admit: 2013-09-24 | Discharge: 2013-09-24 | Disposition: A | Discharge status: Home / Self Care | Payer: Medicare Other | Source: Ambulatory Visit | Attending: Internal Medicine | Admitting: Internal Medicine

## 2013-09-24 NOTE — Progress Notes (Signed)
Physical Therapy Re-evaluation/ Discharge  Patient Details  Name: Vincent Gaines MRN: [id number] Date of Birth: April 21, 1946  Today's Date: 09/24/2013 Time: 9629-5284 PT Time Calculation (min): 32 min              Visit#: 7 of 8  Re-eval: 10/03/13 Diagnosis: Lt TKR Surgical Date: 08/13/13 Next MD Visit: 09/20/12 [place] Authorization: [place] Visit#: 7 of 8  Charges:  Self care 1104-1120 (16'), ROM/strength testing X 1 unit   Subjective Pt states with impending weather; he would like for today to be his last day.  States he is having no difficulty and thinks he can continue to progress on his own.  Currently without pain.    Objective LLE AROM (degrees) Left Knee Extension: 1 (was 4 on 2/11, 12 on 1/26) Left Knee Flexion: 117 (was 110 on 2/11 and 86 on 1/26)  LLE PROM (degrees) Left Knee Extension: 0 (Not tested previously) Left Knee Flexion: 120 (Not tested previously)  LLE Strength Left Knee Flexion: 5/5 (was 4-/5) Left Knee Extension: 5/5 (was 4-/5)  Exercise/Treatments Mobility/Balance  Recumbent bike 8' seat at 8 for ROM/warmup    Physical Therapy Assessment and Plan PT Assessment and Plan Clinical Impression Statement: Pt has completed 7/8 PT visits for Lt TKR.  Pt has progressed overall.  Lt LE strength and ROM is now [facility] and has met all goals.  Pt is independent and compliant with HEP.  discussed with patient and daughter and feel he is ready for discharge to HEP.   PT Plan: discharge to HEP.     Goals Home Exercise Program PT Goal: Perform Home Exercise Program - Progress: Met PT Short Term Goals PT Short Term Goal 1: Pt ROM to be 5- 95 degrees to allow pt to sit with comfort for 30 minutes to be able to enjoy a meal.- Progress: Met PT Short Term Goal 2: Pt to be able to stand for 15 minutes to be able to make a small meal ie fry and egg or make a sandwich.- Progress: Met PT Short Term Goal 3: Pain no greater than a 5/10 for improved  comfort and less medication.- Progress: Met  PT Long Term Goals PT Long Term Goal 1: Pt ROM 2-115 degrees to allow pt to be able to sit for two hours for traveling or eating a meal at a restaurant.- Progress: Met PT Long Term Goal 2: Pt to be able to squat to cut wood.- Progress: Met Long Term Goal 3: Pt to be able to tolerate standing for 30 minutes to cut wood.- Progress: Met Long Term Goal 4: Pt strength to be increased one grade to be able to go up and down steps reciprocally.-Progress: Met PT Long Term Goal 5: Pt pain to be no greater than a 2/10 80% of the day to be able to stop taking prescription medication.-Progress: Met  Problem List Patient Active Problem List   Diagnosis Date Noted  . Stiffness of joint, not elsewhere classified, lower leg 09/03/2013  . Pain and swelling of knee 09/03/2013  . Postoperative anemia due to acute blood loss 08/14/2013  . Hyponatremia 08/14/2013  . Osteoarthritis of left knee 08/13/2013  . OA (osteoarthritis) of knee 08/13/2013    PT - End of Session Activity Tolerance: Patient tolerated treatment well General Behavior During Therapy: WFL for tasks assessed/performed  GP Functional Assessment Tool Used: FOTO Functional Limitation: Mobility: Walking and moving around Mobility: Walking and Moving Around Goal Status [phone]): At least  1 percent but less than 20 percent impaired, limited or restricted Mobility: Walking and Moving Around Discharge Status [phone]): At least 1 percent but less than 20 percent impaired, limited or restricted  Teena Irani, PTA/CLT 09/24/2013, 11:59 AM

## 2013-09-26 ENCOUNTER — Ambulatory Visit (HOSPITAL_COMMUNITY): Payer: Medicare Other | Admitting: *Deleted

## 2014-05-07 ENCOUNTER — Encounter (INDEPENDENT_AMBULATORY_CARE_PROVIDER_SITE_OTHER): Payer: Self-pay | Admitting: *Deleted

## 2014-05-15 ENCOUNTER — Other Ambulatory Visit (INDEPENDENT_AMBULATORY_CARE_PROVIDER_SITE_OTHER): Payer: Self-pay | Admitting: *Deleted

## 2014-05-15 ENCOUNTER — Encounter (INDEPENDENT_AMBULATORY_CARE_PROVIDER_SITE_OTHER): Payer: Self-pay | Admitting: *Deleted

## 2014-05-15 DIAGNOSIS — Z1211 Encounter for screening for malignant neoplasm of colon: Secondary | ICD-10-CM

## 2014-07-11 ENCOUNTER — Telehealth (INDEPENDENT_AMBULATORY_CARE_PROVIDER_SITE_OTHER): Payer: Self-pay | Admitting: *Deleted

## 2014-07-11 DIAGNOSIS — Z1211 Encounter for screening for malignant neoplasm of colon: Secondary | ICD-10-CM

## 2014-07-11 MED ORDER — PEG-KCL-NACL-NASULF-NA ASC-C 100 G PO SOLR
1.0000 | Freq: Once | ORAL | Status: DC
Start: 2014-07-11 — End: 2014-08-14

## 2014-07-11 NOTE — Telephone Encounter (Signed)
Patient needs movi prep 

## 2014-07-15 ENCOUNTER — Telehealth (INDEPENDENT_AMBULATORY_CARE_PROVIDER_SITE_OTHER): Payer: Self-pay | Admitting: *Deleted

## 2014-07-15 NOTE — Telephone Encounter (Signed)
Referring MD/PCP: hall   Procedure: tcs  Reason/Indication:  screening  Has patient had this procedure before?  no  If so, when, by whom and where?    Is there a family history of colon cancer?  no  Who?  What age when diagnosed?    Is patient diabetic?   no      Does patient have prosthetic heart valve?  no  Do you have a pacemaker?  no  Has patient ever had endocarditis? no  Has patient had joint replacement within last 12 months?  no  Does patient tend to be constipated or take laxatives? no  Is patient on Coumadin, Plavix and/or Aspirin? yes  Medications: asa 81 mg daily, alprazolam 0.5 mg prn, meloxicam 7.5 mg daily, simvastatin 40 mg daily  Allergies: nkda  Medication Adjustment: asa 2 days  Procedure date & time: 08/14/14 at 730

## 2014-07-16 NOTE — Telephone Encounter (Signed)
agree

## 2014-08-12 DIAGNOSIS — Z471 Aftercare following joint replacement surgery: Secondary | ICD-10-CM | POA: Diagnosis not present

## 2014-08-12 DIAGNOSIS — Z96652 Presence of left artificial knee joint: Secondary | ICD-10-CM | POA: Diagnosis not present

## 2014-08-14 ENCOUNTER — Ambulatory Visit (HOSPITAL_COMMUNITY)
Admission: RE | Admit: 2014-08-14 | Discharge: 2014-08-14 | Disposition: A | Discharge status: Home / Self Care | Payer: Medicare Other | Source: Ambulatory Visit | Attending: Internal Medicine | Admitting: Internal Medicine

## 2014-08-14 ENCOUNTER — Encounter (HOSPITAL_COMMUNITY)
Admission: RE | Disposition: A | Discharge status: Home / Self Care | Payer: Self-pay | Source: Ambulatory Visit | Attending: Internal Medicine

## 2014-08-14 ENCOUNTER — Encounter (HOSPITAL_COMMUNITY): Payer: Self-pay | Admitting: *Deleted

## 2014-08-14 DIAGNOSIS — Z7982 Long term (current) use of aspirin: Secondary | ICD-10-CM | POA: Diagnosis not present

## 2014-08-14 DIAGNOSIS — M179 Osteoarthritis of knee, unspecified: Secondary | ICD-10-CM | POA: Insufficient documentation

## 2014-08-14 DIAGNOSIS — K5730 Diverticulosis of large intestine without perforation or abscess without bleeding: Secondary | ICD-10-CM | POA: Insufficient documentation

## 2014-08-14 DIAGNOSIS — K644 Residual hemorrhoidal skin tags: Secondary | ICD-10-CM | POA: Insufficient documentation

## 2014-08-14 DIAGNOSIS — Z1211 Encounter for screening for malignant neoplasm of colon: Secondary | ICD-10-CM | POA: Insufficient documentation

## 2014-08-14 DIAGNOSIS — K6289 Other specified diseases of anus and rectum: Secondary | ICD-10-CM | POA: Insufficient documentation

## 2014-08-14 DIAGNOSIS — E780 Pure hypercholesterolemia: Secondary | ICD-10-CM | POA: Diagnosis not present

## 2014-08-14 DIAGNOSIS — K649 Unspecified hemorrhoids: Secondary | ICD-10-CM

## 2014-08-14 DIAGNOSIS — F419 Anxiety disorder, unspecified: Secondary | ICD-10-CM | POA: Insufficient documentation

## 2014-08-14 HISTORY — PX: COLONOSCOPY: SHX5424

## 2014-08-14 SURGERY — COLONOSCOPY
Anesthesia: Moderate Sedation

## 2014-08-14 MED ORDER — MEPERIDINE HCL 50 MG/ML IJ SOLN
INTRAMUSCULAR | Status: AC
  Filled 2014-08-14: qty 1

## 2014-08-14 MED ORDER — STERILE WATER FOR IRRIGATION IR SOLN
Status: DC | PRN
  Administered 2014-08-14: 08:00:00

## 2014-08-14 MED ORDER — MIDAZOLAM HCL 5 MG/5ML IJ SOLN
INTRAMUSCULAR | Status: DC | PRN
  Administered 2014-08-14 (×4): 2 mg via INTRAVENOUS

## 2014-08-14 MED ORDER — SODIUM CHLORIDE 0.9 % IV SOLN
INTRAVENOUS | Status: DC
  Administered 2014-08-14: 07:00:00 via INTRAVENOUS

## 2014-08-14 MED ORDER — MEPERIDINE HCL 50 MG/ML IJ SOLN
INTRAMUSCULAR | Status: DC | PRN
  Administered 2014-08-14 (×2): 25 mg via INTRAVENOUS

## 2014-08-14 MED ORDER — MIDAZOLAM HCL 5 MG/5ML IJ SOLN
INTRAMUSCULAR | Status: AC
  Filled 2014-08-14: qty 10

## 2014-08-14 NOTE — Discharge Instructions (Signed)
Resume usual medications and high fiber diet. °No driving for 24 hours. °Next screening exam in 10 years. ° °Colonoscopy, Care After °These instructions give you information on caring for yourself after your procedure. Your doctor may also give you more specific instructions. Call your doctor if you have any problems or questions after your procedure. °HOME CARE °· Do not drive for 24 hours. °· Do not sign important papers or use machinery for 24 hours. °· You may shower. °· You may go back to your usual activities, but go slower for the first 24 hours. °· Take rest breaks often during the first 24 hours. °· Walk around or use warm packs on your belly (abdomen) if you have belly cramping or gas. °· Drink enough fluids to keep your pee (urine) clear or pale yellow. °· Resume your normal diet. Avoid heavy or fried foods. °· Avoid drinking alcohol for 24 hours or as told by your doctor. °· Only take medicines as told by your doctor. °If a tissue sample (biopsy) was taken during the procedure:  °· Do not take aspirin or blood thinners for 7 days, or as told by your doctor. °· Do not drink alcohol for 7 days, or as told by your doctor. °· Eat soft foods for the first 24 hours. °GET HELP IF: °You still have a small amount of blood in your poop (stool) 2-3 days after the procedure. °GET HELP RIGHT AWAY IF: °· You have more than a small amount of blood in your poop. °· You see clumps of tissue (blood clots) in your poop. °· Your belly is puffy (swollen). °· You feel sick to your stomach (nauseous) or throw up (vomit). °· You have a fever. °· You have belly pain that gets worse and medicine does not help. °MAKE SURE YOU: °· Understand these instructions. °· Will watch your condition. °· Will get help right away if you are not doing well or get worse. °Document Released: 08/28/2010 Document Revised: 07/31/2013 Document Reviewed: 04/02/2013 °ExitCare® Patient Information ©2015 [employer]. This information is not intended to  replace advice given to you by your health care provider. Make sure you discuss any questions you have with your health care provider. °High-Fiber Diet °Fiber is found in fruits, vegetables, and grains. A high-fiber diet encourages the addition of more whole grains, legumes, fruits, and vegetables in your diet. The recommended amount of fiber for adult males is 38 g per day. For adult females, it is 25 g per day. Pregnant and lactating women should get 28 g of fiber per day. If you have a digestive or bowel problem, ask your caregiver for advice before adding high-fiber foods to your diet. Eat a variety of high-fiber foods instead of only a select few type of foods.  °PURPOSE °· To increase stool bulk. °· To make bowel movements more regular to prevent constipation. °· To lower cholesterol. °· To prevent overeating. °WHEN IS THIS DIET USED? °· It may be used if you have constipation and hemorrhoids. °· It may be used if you have uncomplicated diverticulosis (intestine condition) and irritable bowel syndrome. °· It may be used if you need help with weight management. °· It may be used if you want to add it to your diet as a protective measure against atherosclerosis, diabetes, and cancer. °SOURCES OF FIBER °· Whole-grain breads and cereals. °· Fruits, such as apples, oranges, bananas, berries, prunes, and pears. °· Vegetables, such as green peas, carrots, sweet potatoes, beets, broccoli, cabbage, spinach, and   artichokes. °· Legumes, such split peas, soy, lentils. °· Almonds. °FIBER CONTENT IN FOODS °Starches and Grains / Dietary Fiber (g) °· Cheerios, 1 cup / 3 g °· Corn Flakes cereal, 1 cup / 0.7 g °· Rice crispy treat cereal, 1¼ cup / 0.3 g °· Instant oatmeal (cooked), ½ cup / 2 g °· Frosted wheat cereal, 1 cup / 5.1 g °· Brown, long-grain rice (cooked), 1 cup / 3.5 g °· White, long-grain rice (cooked), 1 cup / 0.6 g °· Enriched macaroni (cooked), 1 cup / 2.5 g °Legumes / Dietary Fiber (g) °· Baked beans (canned,  plain, or vegetarian), ½ cup / 5.2 g °· Kidney beans (canned), ½ cup / 6.8 g °· Pinto beans (cooked), ½ cup / 5.5 g °Breads and Crackers / Dietary Fiber (g) °· Plain or honey graham crackers, 2 squares / 0.7 g °· Saltine crackers, 3 squares / 0.3 g °· Plain, salted pretzels, 10 pieces / 1.8 g °· Whole-wheat bread, 1 slice / 1.9 g °· White bread, 1 slice / 0.7 g °· Raisin bread, 1 slice / 1.2 g °· Plain bagel, 3 oz / 2 g °· Flour tortilla, 1 oz / 0.9 g °· Corn tortilla, 1 small / 1.5 g °· Hamburger or hotdog bun, 1 small / 0.9 g °Fruits / Dietary Fiber (g) °· Apple with skin, 1 medium / 4.4 g °· Sweetened applesauce, ½ cup / 1.5 g °· Banana, ½ medium / 1.5 g °· Grapes, 10 grapes / 0.4 g °· Orange, 1 small / 2.3 g °· Raisin, 1.5 oz / 1.6 g °· Melon, 1 cup / 1.4 g °Vegetables / Dietary Fiber (g) °· Green beans (canned), ½ cup / 1.3 g °· Carrots (cooked), ½ cup / 2.3 g °· Broccoli (cooked), ½ cup / 2.8 g °· Peas (cooked), ½ cup / 4.4 g °· Mashed potatoes, ½ cup / 1.6 g °· Lettuce, 1 cup / 0.5 g °· Corn (canned), ½ cup / 1.6 g °· Tomato, ½ cup / 1.1 g °Document Released: 07/26/2005 Document Revised: 01/25/2012 Document Reviewed: 10/28/2011 °ExitCare® Patient Information ©2015 [employer]. This information is not intended to replace advice given to you by your health care provider. Make sure you discuss any questions you have with your health care provider. ° °

## 2014-08-14 NOTE — Op Note (Signed)
COLONOSCOPY PROCEDURE REPORT  PATIENT:  Vincent Gaines  MR#:  [id number] Birthdate:  Sep 28, 1945, 69 y.o., male Endoscopist:  Dr. Rogene Houston, MD Referred By:  Dr. Delphina Cahill, MD  Procedure Date: 08/14/2014  Procedure:   Colonoscopy  Indications:  Patient is 69 year old Caucasian male was undergoing average risk screening colonoscopy. This is patient's first exam.  Informed Consent:  The procedure and risks were reviewed with the patient and informed consent was obtained.  Medications:  Demerol 50 mg IV Versed 8 mg IV  Description of procedure:  After a digital rectal exam was performed, that colonoscope was advanced from the anus through the rectum and colon to the area of the cecum, ileocecal valve and appendiceal orifice. The cecum was deeply intubated. These structures were well-seen and photographed for the record. From the level of the cecum and ileocecal valve, the scope was slowly and cautiously withdrawn. The mucosal surfaces were carefully surveyed utilizing scope tip to flexion to facilitate fold flattening as needed. The scope was pulled down into the rectum where a thorough exam including retroflexion was performed.  Findings:   Prep satisfactory. Few scattered diverticula at sigmoid colon. Normal rectal mucosa. Single anal papilla and small hemorrhoids below the dentate line.   Therapeutic/Diagnostic Maneuvers Performed:  None  Complications:  None  Cecal Withdrawal Time:  10 minutes  Impression:  Examination performed to cecum. Mild sigmoid colon diverticulosis. Single anal papilla and small external hemorrhoids.  Recommendations:  Standard instructions given. High fiber diet. Next screening exam in 10 years.  [name]  08/14/2014 8:03 AM  CC: Dr. Delphina Cahill, MD & Dr. Rayne Du ref. provider found
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
## 2014-08-15 ENCOUNTER — Encounter (HOSPITAL_COMMUNITY): Payer: Self-pay | Admitting: Internal Medicine

## 2014-08-20 DIAGNOSIS — H35373 Puckering of macula, bilateral: Secondary | ICD-10-CM | POA: Diagnosis not present

## 2014-08-20 DIAGNOSIS — H3532 Exudative age-related macular degeneration: Secondary | ICD-10-CM | POA: Diagnosis not present

## 2014-08-20 DIAGNOSIS — H43811 Vitreous degeneration, right eye: Secondary | ICD-10-CM | POA: Diagnosis not present

## 2014-08-28 DIAGNOSIS — H3532 Exudative age-related macular degeneration: Secondary | ICD-10-CM | POA: Diagnosis not present

## 2014-10-21 DIAGNOSIS — E782 Mixed hyperlipidemia: Secondary | ICD-10-CM | POA: Diagnosis not present

## 2014-10-21 DIAGNOSIS — Z125 Encounter for screening for malignant neoplasm of prostate: Secondary | ICD-10-CM | POA: Diagnosis not present

## 2014-10-23 DIAGNOSIS — I10 Essential (primary) hypertension: Secondary | ICD-10-CM | POA: Diagnosis not present

## 2014-10-23 DIAGNOSIS — Z6827 Body mass index (BMI) 27.0-27.9, adult: Secondary | ICD-10-CM | POA: Diagnosis not present

## 2014-10-23 DIAGNOSIS — Z0000 Encounter for general adult medical examination without abnormal findings: Secondary | ICD-10-CM | POA: Diagnosis not present

## 2014-10-28 DIAGNOSIS — H43811 Vitreous degeneration, right eye: Secondary | ICD-10-CM | POA: Diagnosis not present

## 2014-10-28 DIAGNOSIS — H3532 Exudative age-related macular degeneration: Secondary | ICD-10-CM | POA: Diagnosis not present

## 2014-10-28 DIAGNOSIS — H35373 Puckering of macula, bilateral: Secondary | ICD-10-CM | POA: Diagnosis not present

## 2014-11-11 DIAGNOSIS — H3532 Exudative age-related macular degeneration: Secondary | ICD-10-CM | POA: Diagnosis not present

## 2015-01-28 DIAGNOSIS — H3532 Exudative age-related macular degeneration: Secondary | ICD-10-CM | POA: Diagnosis not present

## 2015-01-28 DIAGNOSIS — H43813 Vitreous degeneration, bilateral: Secondary | ICD-10-CM | POA: Diagnosis not present

## 2015-01-28 DIAGNOSIS — H35373 Puckering of macula, bilateral: Secondary | ICD-10-CM | POA: Diagnosis not present

## 2015-01-29 DIAGNOSIS — H3532 Exudative age-related macular degeneration: Secondary | ICD-10-CM | POA: Diagnosis not present

## 2015-02-21 DIAGNOSIS — F419 Anxiety disorder, unspecified: Secondary | ICD-10-CM | POA: Diagnosis not present

## 2015-04-03 DIAGNOSIS — H2513 Age-related nuclear cataract, bilateral: Secondary | ICD-10-CM | POA: Diagnosis not present

## 2015-04-03 DIAGNOSIS — H538 Other visual disturbances: Secondary | ICD-10-CM | POA: Diagnosis not present

## 2015-04-08 DIAGNOSIS — H43813 Vitreous degeneration, bilateral: Secondary | ICD-10-CM | POA: Diagnosis not present

## 2015-04-08 DIAGNOSIS — H3532 Exudative age-related macular degeneration: Secondary | ICD-10-CM | POA: Diagnosis not present

## 2015-04-08 DIAGNOSIS — H35373 Puckering of macula, bilateral: Secondary | ICD-10-CM | POA: Diagnosis not present

## 2015-04-22 DIAGNOSIS — R7301 Impaired fasting glucose: Secondary | ICD-10-CM | POA: Diagnosis not present

## 2015-04-22 DIAGNOSIS — I10 Essential (primary) hypertension: Secondary | ICD-10-CM | POA: Diagnosis not present

## 2015-04-22 DIAGNOSIS — E782 Mixed hyperlipidemia: Secondary | ICD-10-CM | POA: Diagnosis not present

## 2015-04-24 DIAGNOSIS — Z720 Tobacco use: Secondary | ICD-10-CM | POA: Diagnosis not present

## 2015-04-24 DIAGNOSIS — E785 Hyperlipidemia, unspecified: Secondary | ICD-10-CM | POA: Diagnosis not present

## 2015-04-24 DIAGNOSIS — Z23 Encounter for immunization: Secondary | ICD-10-CM | POA: Diagnosis not present

## 2015-04-24 DIAGNOSIS — R7301 Impaired fasting glucose: Secondary | ICD-10-CM | POA: Diagnosis not present

## 2015-04-24 DIAGNOSIS — I10 Essential (primary) hypertension: Secondary | ICD-10-CM | POA: Diagnosis not present

## 2015-05-01 ENCOUNTER — Encounter (HOSPITAL_COMMUNITY)
Admission: RE | Admit: 2015-05-01 | Discharge: 2015-05-01 | Disposition: A | Discharge status: Home / Self Care | Payer: Medicare Other | Source: Ambulatory Visit | Attending: Ophthalmology | Admitting: Ophthalmology

## 2015-05-01 ENCOUNTER — Encounter (HOSPITAL_COMMUNITY): Payer: Self-pay

## 2015-05-01 ENCOUNTER — Other Ambulatory Visit: Payer: Self-pay

## 2015-05-01 DIAGNOSIS — H2511 Age-related nuclear cataract, right eye: Secondary | ICD-10-CM | POA: Diagnosis not present

## 2015-05-01 DIAGNOSIS — Z01818 Encounter for other preprocedural examination: Secondary | ICD-10-CM | POA: Insufficient documentation

## 2015-05-01 DIAGNOSIS — H538 Other visual disturbances: Secondary | ICD-10-CM | POA: Diagnosis not present

## 2015-05-01 HISTORY — DX: Unspecified macular degeneration: H35.30

## 2015-05-01 LAB — BASIC METABOLIC PANEL
Anion gap: 7 (ref 5–15)
BUN: 11 mg/dL (ref 6–20)
CALCIUM: 9.2 mg/dL (ref 8.9–10.3)
CHLORIDE: 102 mmol/L (ref 101–111)
CO2: 30 mmol/L (ref 22–32)
CREATININE: 0.94 mg/dL (ref 0.61–1.24)
GFR calc Af Amer: >60 mL/min (ref >60)
GFR calc non Af Amer: >60 mL/min (ref >60)
GLUCOSE: 132 mg/dL — AB (ref 65–99)
Potassium: 4.4 mmol/L (ref 3.5–5.1)
Sodium: 139 mmol/L (ref 135–145)

## 2015-05-01 LAB — CBC WITH DIFFERENTIAL/PLATELET
Basophils Absolute: 0.1 10*3/uL (ref 0.0–0.1)
Basophils Relative: 1 %
Eosinophils Absolute: 0.1 10*3/uL (ref 0.0–0.7)
Eosinophils Relative: 1 %
HEMATOCRIT: 39.9 % (ref 39.0–52.0)
HEMOGLOBIN: 13.8 g/dL (ref 13.0–17.0)
LYMPHS ABS: 2.7 10*3/uL (ref 0.7–4.0)
Lymphocytes Relative: 20 %
MCH: 33.4 pg (ref 26.0–34.0)
MCHC: 34.6 g/dL (ref 30.0–36.0)
MCV: 96.6 fL (ref 78.0–100.0)
MONO ABS: 1 10*3/uL (ref 0.1–1.0)
Monocytes Relative: 7 %
NEUTROS ABS: 9.9 10*3/uL — AB (ref 1.7–7.7)
NEUTROS PCT: 71 %
Platelets: 300 10*3/uL (ref 150–400)
RBC: 4.13 MIL/uL — ABNORMAL LOW (ref 4.22–5.81)
RDW: 14.3 % (ref 11.5–15.5)
WBC: 13.8 10*3/uL — ABNORMAL HIGH (ref 4.0–10.5)

## 2015-05-01 NOTE — Patient Instructions (Signed)
Your procedure is scheduled on: 05/12/2015  Report to [facility] at   700  AM.  Call this number if you have problems the morning of surgery: [phone]   Do not eat food or drink liquids :After Midnight.      Take these medicines the morning of surgery with A SIP OF WATER: xanax, mocib, robaxin, oxycodone, ultram   Do not wear jewelry, make-up or nail polish.  Do not wear lotions, powders, or perfumes. You may wear deodorant.  Do not shave 48 hours prior to surgery.  Do not bring valuables to the hospital.  Contacts, dentures or bridgework may not be worn into surgery.  Leave suitcase in the car. After surgery it may be brought to your room.  For patients admitted to the hospital, checkout time is 11:00 AM the day of discharge.   Patients discharged the day of surgery will not be allowed to drive home.  :     Please read over the following fact sheets that you were given: Coughing and Deep Breathing, Surgical Site Infection Prevention, Anesthesia Post-op Instructions and Care and Recovery After Surgery    Cataract A cataract is a clouding of the lens of the eye. When a lens becomes cloudy, vision is reduced based on the degree and nature of the clouding. Many cataracts reduce vision to some degree. Some cataracts make people more near-sighted as they develop. Other cataracts increase glare. Cataracts that are ignored and become worse can sometimes look white. The white color can be seen through the pupil. CAUSES   Aging. However, cataracts may occur at any age, even in newborns.   Certain drugs.   Trauma to the eye.   Certain diseases such as diabetes.   Specific eye diseases such as chronic inflammation inside the eye or a sudden attack of a rare form of glaucoma.   Inherited or acquired medical problems.  SYMPTOMS   Gradual, progressive drop in vision in the affected eye.   Severe, rapid visual loss. This most often happens when trauma is the cause.  DIAGNOSIS  To detect  a cataract, an eye doctor examines the lens. Cataracts are best diagnosed with an exam of the eyes with the pupils enlarged (dilated) by drops.  TREATMENT  For an early cataract, vision may improve by using different eyeglasses or stronger lighting. If that does not help your vision, surgery is the only effective treatment. A cataract needs to be surgically removed when vision loss interferes with your everyday activities, such as driving, reading, or watching TV. A cataract may also have to be removed if it prevents examination or treatment of another eye problem. Surgery removes the cloudy lens and usually replaces it with a substitute lens (intraocular lens, IOL).  At a time when both you and your doctor agree, the cataract will be surgically removed. If you have cataracts in both eyes, only one is usually removed at a time. This allows the operated eye to heal and be out of danger from any possible problems after surgery (such as infection or poor wound healing). In rare cases, a cataract may be doing damage to your eye. In these cases, your caregiver may advise surgical removal right away. The vast majority of people who have cataract surgery have better vision afterward. HOME CARE INSTRUCTIONS  If you are not planning surgery, you may be asked to do the following:  Use different eyeglasses.   Use stronger or brighter lighting.   Ask your eye doctor about  reducing your medicine dose or changing medicines if it is thought that a medicine caused your cataract. Changing medicines does not make the cataract go away on its own.   Become familiar with your surroundings. Poor vision can lead to injury. Avoid bumping into things on the affected side. You are at a higher risk for tripping or falling.   Exercise extreme care when driving or operating machinery.   Wear sunglasses if you are sensitive to bright light or experiencing problems with glare.  SEEK IMMEDIATE MEDICAL CARE IF:   You have a  worsening or sudden vision loss.   You notice redness, swelling, or increasing pain in the eye.   You have a fever.  Document Released: 07/26/2005 Document Revised: 07/15/2011 Document Reviewed: 03/19/2011 [facility] Patient Information 2012 [place].PATIENT INSTRUCTIONS POST-ANESTHESIA  IMMEDIATELY FOLLOWING SURGERY:  Do not drive or operate machinery for the first twenty four hours after surgery.  Do not make any important decisions for twenty four hours after surgery or while taking narcotic pain medications or sedatives.  If you develop intractable nausea and vomiting or a severe headache please notify your doctor immediately.  FOLLOW-UP:  Please make an appointment with your surgeon as instructed. You do not need to follow up with anesthesia unless specifically instructed to do so.  WOUND CARE INSTRUCTIONS (if applicable):  Keep a dry clean dressing on the anesthesia/puncture wound site if there is drainage.  Once the wound has quit draining you may leave it open to air.  Generally you should leave the bandage intact for twenty four hours unless there is drainage.  If the epidural site drains for more than 36-48 hours please call the anesthesia department.  QUESTIONS?:  Please feel free to call your physician or the hospital operator if you have any questions, and they will be happy to assist you.

## 2015-05-01 NOTE — Pre-Procedure Instructions (Signed)
Patient given information to sign up for my chart at home. 

## 2015-05-12 ENCOUNTER — Ambulatory Visit (HOSPITAL_COMMUNITY)
Admission: RE | Admit: 2015-05-12 | Discharge: 2015-05-12 | Disposition: A | Discharge status: Home / Self Care | Payer: Medicare Other | Source: Ambulatory Visit | Attending: Ophthalmology | Admitting: Ophthalmology

## 2015-05-12 ENCOUNTER — Ambulatory Visit (HOSPITAL_COMMUNITY): Payer: Medicare Other | Admitting: Anesthesiology

## 2015-05-12 ENCOUNTER — Encounter (HOSPITAL_COMMUNITY): Payer: Self-pay | Admitting: Anesthesiology

## 2015-05-12 ENCOUNTER — Encounter (HOSPITAL_COMMUNITY)
Admission: RE | Disposition: A | Discharge status: Home / Self Care | Payer: Self-pay | Source: Ambulatory Visit | Attending: Ophthalmology

## 2015-05-12 DIAGNOSIS — Z87891 Personal history of nicotine dependence: Secondary | ICD-10-CM | POA: Diagnosis not present

## 2015-05-12 DIAGNOSIS — Z7982 Long term (current) use of aspirin: Secondary | ICD-10-CM | POA: Insufficient documentation

## 2015-05-12 DIAGNOSIS — H538 Other visual disturbances: Secondary | ICD-10-CM | POA: Diagnosis not present

## 2015-05-12 DIAGNOSIS — Z79899 Other long term (current) drug therapy: Secondary | ICD-10-CM | POA: Diagnosis not present

## 2015-05-12 DIAGNOSIS — H2511 Age-related nuclear cataract, right eye: Secondary | ICD-10-CM | POA: Insufficient documentation

## 2015-05-12 DIAGNOSIS — F419 Anxiety disorder, unspecified: Secondary | ICD-10-CM | POA: Diagnosis not present

## 2015-05-12 DIAGNOSIS — H269 Unspecified cataract: Secondary | ICD-10-CM | POA: Diagnosis not present

## 2015-05-12 HISTORY — PX: CATARACT EXTRACTION W/PHACO: SHX586

## 2015-05-12 SURGERY — PHACOEMULSIFICATION, CATARACT, WITH IOL INSERTION
Anesthesia: Monitor Anesthesia Care | Site: Eye | Laterality: Right

## 2015-05-12 MED ORDER — FENTANYL CITRATE (PF) 100 MCG/2ML IJ SOLN
INTRAMUSCULAR | Status: AC
  Filled 2015-05-12: qty 2

## 2015-05-12 MED ORDER — TETRACAINE HCL 0.5 % OP SOLN
1.0000 [drp] | OPHTHALMIC | Status: AC
  Administered 2015-05-12 (×3): 1 [drp] via OPHTHALMIC

## 2015-05-12 MED ORDER — CYCLOPENTOLATE-PHENYLEPHRINE 0.2-1 % OP SOLN
1.0000 [drp] | OPHTHALMIC | Status: AC
  Administered 2015-05-12 (×3): 1 [drp] via OPHTHALMIC

## 2015-05-12 MED ORDER — TETRACAINE HCL 0.5 % OP SOLN
OPHTHALMIC | Status: AC
  Filled 2015-05-12: qty 2

## 2015-05-12 MED ORDER — MIDAZOLAM HCL 2 MG/2ML IJ SOLN
INTRAMUSCULAR | Status: AC
  Filled 2015-05-12: qty 2

## 2015-05-12 MED ORDER — CYCLOPENTOLATE-PHENYLEPHRINE OP SOLN OPTIME - NO CHARGE
OPHTHALMIC | Status: AC
  Filled 2015-05-12: qty 2

## 2015-05-12 MED ORDER — FENTANYL CITRATE (PF) 100 MCG/2ML IJ SOLN
25.0000 ug | INTRAMUSCULAR | Status: AC
  Administered 2015-05-12 (×2): 25 ug via INTRAVENOUS

## 2015-05-12 MED ORDER — LIDOCAINE HCL 3.5 % OP GEL
OPHTHALMIC | Status: AC
  Filled 2015-05-12: qty 1

## 2015-05-12 MED ORDER — LIDOCAINE HCL 3.5 % OP GEL
1.0000 "application " | Freq: Once | OPHTHALMIC | Status: AC
  Administered 2015-05-12: 1 via OPHTHALMIC

## 2015-05-12 MED ORDER — LACTATED RINGERS IV SOLN
INTRAVENOUS | Status: DC
  Administered 2015-05-12: 1000 mL via INTRAVENOUS

## 2015-05-12 MED ORDER — MIDAZOLAM HCL 2 MG/2ML IJ SOLN
1.0000 mg | INTRAMUSCULAR | Status: DC | PRN
  Administered 2015-05-12 (×2): 2 mg via INTRAVENOUS
  Filled 2015-05-12: qty 2

## 2015-05-12 MED ORDER — LIDOCAINE HCL 3.5 % OP GEL
OPHTHALMIC | Status: DC | PRN
  Administered 2015-05-12: 1 via OPHTHALMIC

## 2015-05-12 MED ORDER — BSS IO SOLN
INTRAOCULAR | Status: DC | PRN
  Administered 2015-05-12: 15 mL

## 2015-05-12 MED ORDER — PHENYLEPHRINE-KETOROLAC 1-0.3 % IO SOLN
INTRAOCULAR | Status: AC
  Filled 2015-05-12: qty 4

## 2015-05-12 MED ORDER — NA HYALUR & NA CHOND-NA HYALUR 0.55-0.5 ML IO KIT
PACK | INTRAOCULAR | Status: DC | PRN
  Administered 2015-05-12: 1 via OPHTHALMIC

## 2015-05-12 MED ORDER — PHENYLEPHRINE-KETOROLAC 1-0.3 % IO SOLN
INTRAOCULAR | Status: DC | PRN
  Administered 2015-05-12: 500 mL via OPHTHALMIC

## 2015-05-12 MED ORDER — POVIDONE-IODINE 5 % OP SOLN
OPHTHALMIC | Status: DC | PRN
  Administered 2015-05-12: 1 via OPHTHALMIC

## 2015-05-12 MED ORDER — TETRACAINE 0.5 % OP SOLN OPTIME - NO CHARGE
OPHTHALMIC | Status: DC | PRN
  Administered 2015-05-12: 1 [drp] via OPHTHALMIC

## 2015-05-12 SURGICAL SUPPLY — 8 items
CLOTH BEACON ORANGE TIMEOUT ST (SAFETY) ×2 IMPLANT
GLOVE BIOGEL PI IND STRL 6.5 (GLOVE) IMPLANT
GLOVE BIOGEL PI INDICATOR 6.5 (GLOVE) ×2
GLOVE EXAM NITRILE MD LF STRL (GLOVE) ×2 IMPLANT
INST SET CATARACT ~~LOC~~ (KITS) ×3 IMPLANT
LENS ALC ACRYL/TECN (Ophthalmic Related) ×3 IMPLANT
PAD ARMBOARD 7.5X6 YLW CONV (MISCELLANEOUS) ×2 IMPLANT
WATER STERILE IRR 250ML POUR (IV SOLUTION) ×2 IMPLANT

## 2015-05-12 NOTE — H&P (Signed)
I have reviewed the pre printed H&P, the patient was re-examined, and I have identified no significant interval changes in the patient's medical condition.  There is no change in the plan of care since the history and physical of record. 

## 2015-05-12 NOTE — Anesthesia Postprocedure Evaluation (Signed)
  Anesthesia Post-op Note  Patient: Vincent Gaines  Procedure(s) Performed: Procedure(s) with comments: CATARACT EXTRACTION PHACO AND INTRAOCULAR LENS PLACEMENT (IOC) (Right) - CDE:3.34  Patient Location: Short Stay  Anesthesia Type:MAC  Level of Consciousness: awake, alert  and oriented  Airway and Oxygen Therapy: Patient Spontanous Breathing  Post-op Pain: none  Post-op Assessment: Post-op Vital signs reviewed, Patient's Cardiovascular Status Stable, Respiratory Function Stable, Patent Airway and No signs of Nausea or vomiting              Post-op Vital Signs: Reviewed and stable  Last Vitals:  Filed Vitals:   05/12/15 0830  BP: 129/75  Temp:   Resp: 27    Complications: No apparent anesthesia complications

## 2015-05-12 NOTE — Anesthesia Preprocedure Evaluation (Signed)
Anesthesia Evaluation  Patient identified by MRN, date of birth, ID band Patient awake    Reviewed: Allergy & Precautions, H&P , NPO status , Patient's Chart, lab work & pertinent test results  Airway Mallampati: II  TM Distance: >3 FB Neck ROM: Full    Dental no notable dental hx. (+) Teeth Intact   Pulmonary neg pulmonary ROS, former smoker,    Pulmonary exam normal breath sounds clear to auscultation       Cardiovascular negative cardio ROS Normal cardiovascular exam Rhythm:Regular Rate:Normal     Neuro/Psych Anxiety negative neurological ROS  negative psych ROS   GI/Hepatic negative GI ROS, Neg liver ROS,   Endo/Other  negative endocrine ROS  Renal/GU negative Renal ROS  negative genitourinary   Musculoskeletal negative musculoskeletal ROS (+)   Abdominal   Peds negative pediatric ROS (+)  Hematology negative hematology ROS (+)   Anesthesia Other Findings   Reproductive/Obstetrics negative OB ROS                             Anesthesia Physical Anesthesia Plan  ASA: II  Anesthesia Plan: MAC   Post-op Pain Management:    Induction: Intravenous  Airway Management Planned: Nasal Cannula  Additional Equipment:   Intra-op Plan:   Post-operative Plan:   Informed Consent: I have reviewed the patients History and Physical, chart, labs and discussed the procedure including the risks, benefits and alternatives for the proposed anesthesia with the patient or authorized representative who has indicated his/her understanding and acceptance.     Plan Discussed with:   Anesthesia Plan Comments:         Anesthesia Quick Evaluation

## 2015-05-12 NOTE — Addendum Note (Signed)
Addendum  created 05/12/15 0950 by Ollen Bowl, CRNA   Modules edited: Anesthesia Responsible Staff

## 2015-05-12 NOTE — Brief Op Note (Signed)
05/12/2015  9:34 AM  PATIENT:  Vincent Gaines  69 y.o. male  PRE-OPERATIVE DIAGNOSIS:  nuclear cataract right eye  POST-OPERATIVE DIAGNOSIS:  nuclear cataract right eye  PROCEDURE:  Procedure(s): CATARACT EXTRACTION PHACO AND INTRAOCULAR LENS PLACEMENT (IOC)  SURGEON:  Surgeon(s): Williams Che, MD  ASSISTANTS: Zoila Shutter, CST   ANESTHESIA STAFF: No anesthesia staff entered.  ANESTHESIA:   topical and MAC  REQUESTED LENS POWER: 21.0  LENS IMPLANT INFORMATION:  Alcon SN60WF  S/n [id number]  Exp- 05/2015  CUMULATIVE DISSIPATED ENERGY:3.34  INDICATIONS:see office H&P for indications  OP FINDINGS:dense NS  COMPLICATIONS:None  DICTATION #: none  PLAN OF CARE:   As above  PATIENT DISPOSITION:  Short Stay

## 2015-05-12 NOTE — Discharge Instructions (Signed)

## 2015-05-12 NOTE — Op Note (Signed)
05/12/2015  9:34 AM  PATIENT:  Vincent Gaines  68 y.o. male  PRE-OPERATIVE DIAGNOSIS:  nuclear cataract right eye  POST-OPERATIVE DIAGNOSIS:  nuclear cataract right eye  PROCEDURE:  Procedure(s): CATARACT EXTRACTION PHACO AND INTRAOCULAR LENS PLACEMENT (IOC)  SURGEON:  Surgeon(s): Williams Che, MD  ASSISTANTS: Zoila Shutter, CST   ANESTHESIA STAFF: No anesthesia staff entered.  ANESTHESIA:   topical and MAC  REQUESTED LENS POWER: 21.0  LENS IMPLANT INFORMATION:  Alcon SN60WF  S/n [id number]  Exp- 05/2015  CUMULATIVE DISSIPATED ENERGY:3.34  INDICATIONS:see office H&P for indications  OP FINDINGS:dense NS  COMPLICATIONS:None  PROCEDURE:  The patient was brought to the operating room in good condition.  The operative eye was prepped and draped in the usual fashion for intraocular surgery.  Lidocaine gel was dropped onto the eye.  A 2.4 mm 10 O'clock near clear corneal stepped incision and a 12 O'clock stab incision were created.  Viscoat was instilled into the anterior chamber.  The 5 mm anterior capsulorhexis was performed with a bent needle cystotome and Utrata forceps.  The lens was hydrodissected and hydrodelineated with a cannula and balanced salt solution and rotated with a Kuglen hook.  Phacoemulsification was perfomed in the divide and conquer technique.  The remaining cortex was removed with I&A and the capsular surfaces polished as necessary.  Provisc was placed into the capsular bag and the lens inserted with the Alcon inserter.  The viscoelastic was removed with I&A and the lens "rocked" into position.  The wounds were hydrated and te anterior chamber was refilled with balanced salt solution.  The wounds were checked for leakage and rehydrated as necessary.  The lid speculum and drapes were removed and the patient was transported to short stay in good condition.  PATIENT DISPOSITION:  Short Stay

## 2015-05-12 NOTE — Transfer of Care (Signed)
Immediate Anesthesia Transfer of Care Note  Patient: Vincent Gaines  Procedure(s) Performed: Procedure(s) with comments: CATARACT EXTRACTION PHACO AND INTRAOCULAR LENS PLACEMENT (IOC) (Right) - CDE:3.34  Patient Location: Short Stay  Anesthesia Type:MAC  Level of Consciousness: awake  Airway & Oxygen Therapy: Patient Spontanous Breathing  Post-op Assessment: Report given to RN  Post vital signs: Reviewed  Last Vitals:  Filed Vitals:   05/12/15 0830  BP: 129/75  Temp:   Resp: 27    Complications: No apparent anesthesia complications

## 2015-05-13 ENCOUNTER — Encounter (HOSPITAL_COMMUNITY): Payer: Self-pay | Admitting: Ophthalmology

## 2015-06-17 DIAGNOSIS — H353221 Exudative age-related macular degeneration, left eye, with active choroidal neovascularization: Secondary | ICD-10-CM | POA: Diagnosis not present

## 2015-06-17 DIAGNOSIS — H35373 Puckering of macula, bilateral: Secondary | ICD-10-CM | POA: Diagnosis not present

## 2015-06-17 DIAGNOSIS — H43813 Vitreous degeneration, bilateral: Secondary | ICD-10-CM | POA: Diagnosis not present

## 2015-06-17 DIAGNOSIS — H353112 Nonexudative age-related macular degeneration, right eye, intermediate dry stage: Secondary | ICD-10-CM | POA: Diagnosis not present

## 2015-07-08 DIAGNOSIS — H2512 Age-related nuclear cataract, left eye: Secondary | ICD-10-CM | POA: Diagnosis not present

## 2015-07-08 DIAGNOSIS — H538 Other visual disturbances: Secondary | ICD-10-CM | POA: Diagnosis not present

## 2015-07-10 ENCOUNTER — Encounter (HOSPITAL_COMMUNITY)
Admission: RE | Admit: 2015-07-10 | Discharge: 2015-07-10 | Disposition: A | Discharge status: Home / Self Care | Payer: Medicare Other | Source: Ambulatory Visit | Attending: Ophthalmology | Admitting: Ophthalmology

## 2015-07-10 NOTE — Patient Instructions (Signed)
Your procedure is scheduled on: 07/14/2015   Report to [facility] at  945  AM.  Call this number if you have problems the morning of surgery: [phone]   Do not eat food or drink liquids :After Midnight.      Take these medicines the morning of surgery with A SIP OF WATER: xanax.   Do not wear jewelry, make-up or nail polish.  Do not wear lotions, powders, or perfumes. You may wear deodorant.  Do not shave 48 hours prior to surgery.  Do not bring valuables to the hospital.  Contacts, dentures or bridgework may not be worn into surgery.  Leave suitcase in the car. After surgery it may be brought to your room.  For patients admitted to the hospital, checkout time is 11:00 AM the day of discharge.   Patients discharged the day of surgery will not be allowed to drive home.  :     Please read over the following fact sheets that you were given: Coughing and Deep Breathing, Surgical Site Infection Prevention, Anesthesia Post-op Instructions and Care and Recovery After Surgery    Cataract A cataract is a clouding of the lens of the eye. When a lens becomes cloudy, vision is reduced based on the degree and nature of the clouding. Many cataracts reduce vision to some degree. Some cataracts make people more near-sighted as they develop. Other cataracts increase glare. Cataracts that are ignored and become worse can sometimes look white. The white color can be seen through the pupil. CAUSES   Aging. However, cataracts may occur at any age, even in newborns.   Certain drugs.   Trauma to the eye.   Certain diseases such as diabetes.   Specific eye diseases such as chronic inflammation inside the eye or a sudden attack of a rare form of glaucoma.   Inherited or acquired medical problems.  SYMPTOMS   Gradual, progressive drop in vision in the affected eye.   Severe, rapid visual loss. This most often happens when trauma is the cause.  DIAGNOSIS  To detect a cataract, an eye doctor examines  the lens. Cataracts are best diagnosed with an exam of the eyes with the pupils enlarged (dilated) by drops.  TREATMENT  For an early cataract, vision may improve by using different eyeglasses or stronger lighting. If that does not help your vision, surgery is the only effective treatment. A cataract needs to be surgically removed when vision loss interferes with your everyday activities, such as driving, reading, or watching TV. A cataract may also have to be removed if it prevents examination or treatment of another eye problem. Surgery removes the cloudy lens and usually replaces it with a substitute lens (intraocular lens, IOL).  At a time when both you and your doctor agree, the cataract will be surgically removed. If you have cataracts in both eyes, only one is usually removed at a time. This allows the operated eye to heal and be out of danger from any possible problems after surgery (such as infection or poor wound healing). In rare cases, a cataract may be doing damage to your eye. In these cases, your caregiver may advise surgical removal right away. The vast majority of people who have cataract surgery have better vision afterward. HOME CARE INSTRUCTIONS  If you are not planning surgery, you may be asked to do the following:  Use different eyeglasses.   Use stronger or brighter lighting.   Ask your eye doctor about reducing your medicine dose  or changing medicines if it is thought that a medicine caused your cataract. Changing medicines does not make the cataract go away on its own.   Become familiar with your surroundings. Poor vision can lead to injury. Avoid bumping into things on the affected side. You are at a higher risk for tripping or falling.   Exercise extreme care when driving or operating machinery.   Wear sunglasses if you are sensitive to bright light or experiencing problems with glare.  SEEK IMMEDIATE MEDICAL CARE IF:   You have a worsening or sudden vision loss.    You notice redness, swelling, or increasing pain in the eye.   You have a fever.  Document Released: 07/26/2005 Document Revised: 07/15/2011 Document Reviewed: 03/19/2011 [facility] Patient Information 2012 [place].PATIENT INSTRUCTIONS POST-ANESTHESIA  IMMEDIATELY FOLLOWING SURGERY:  Do not drive or operate machinery for the first twenty four hours after surgery.  Do not make any important decisions for twenty four hours after surgery or while taking narcotic pain medications or sedatives.  If you develop intractable nausea and vomiting or a severe headache please notify your doctor immediately.  FOLLOW-UP:  Please make an appointment with your surgeon as instructed. You do not need to follow up with anesthesia unless specifically instructed to do so.  WOUND CARE INSTRUCTIONS (if applicable):  Keep a dry clean dressing on the anesthesia/puncture wound site if there is drainage.  Once the wound has quit draining you may leave it open to air.  Generally you should leave the bandage intact for twenty four hours unless there is drainage.  If the epidural site drains for more than 36-48 hours please call the anesthesia department.  QUESTIONS?:  Please feel free to call your physician or the hospital operator if you have any questions, and they will be happy to assist you.

## 2015-07-14 ENCOUNTER — Ambulatory Visit (HOSPITAL_COMMUNITY): Payer: Medicare Other | Admitting: Anesthesiology

## 2015-07-14 ENCOUNTER — Encounter (HOSPITAL_COMMUNITY): Payer: Self-pay | Admitting: *Deleted

## 2015-07-14 ENCOUNTER — Ambulatory Visit (HOSPITAL_COMMUNITY)
Admission: RE | Admit: 2015-07-14 | Discharge: 2015-07-14 | Disposition: A | Discharge status: Home / Self Care | Payer: Medicare Other | Source: Ambulatory Visit | Attending: Ophthalmology | Admitting: Ophthalmology

## 2015-07-14 ENCOUNTER — Encounter (HOSPITAL_COMMUNITY)
Admission: RE | Disposition: A | Discharge status: Home / Self Care | Payer: Self-pay | Source: Ambulatory Visit | Attending: Ophthalmology

## 2015-07-14 DIAGNOSIS — Z7982 Long term (current) use of aspirin: Secondary | ICD-10-CM | POA: Insufficient documentation

## 2015-07-14 DIAGNOSIS — H2512 Age-related nuclear cataract, left eye: Secondary | ICD-10-CM | POA: Diagnosis not present

## 2015-07-14 DIAGNOSIS — H538 Other visual disturbances: Secondary | ICD-10-CM | POA: Diagnosis not present

## 2015-07-14 DIAGNOSIS — Z87891 Personal history of nicotine dependence: Secondary | ICD-10-CM | POA: Diagnosis not present

## 2015-07-14 DIAGNOSIS — F419 Anxiety disorder, unspecified: Secondary | ICD-10-CM | POA: Diagnosis not present

## 2015-07-14 DIAGNOSIS — H268 Other specified cataract: Secondary | ICD-10-CM | POA: Insufficient documentation

## 2015-07-14 DIAGNOSIS — H269 Unspecified cataract: Secondary | ICD-10-CM | POA: Diagnosis not present

## 2015-07-14 DIAGNOSIS — Z79899 Other long term (current) drug therapy: Secondary | ICD-10-CM | POA: Diagnosis not present

## 2015-07-14 HISTORY — PX: CATARACT EXTRACTION W/PHACO: SHX586

## 2015-07-14 SURGERY — PHACOEMULSIFICATION, CATARACT, WITH IOL INSERTION
Anesthesia: Monitor Anesthesia Care | Laterality: Left

## 2015-07-14 MED ORDER — NA HYALUR & NA CHOND-NA HYALUR 0.55-0.5 ML IO KIT
PACK | INTRAOCULAR | Status: DC | PRN
  Administered 2015-07-14: 1 via OPHTHALMIC

## 2015-07-14 MED ORDER — FENTANYL CITRATE (PF) 100 MCG/2ML IJ SOLN
INTRAMUSCULAR | Status: AC
  Filled 2015-07-14: qty 2

## 2015-07-14 MED ORDER — BSS IO SOLN
INTRAOCULAR | Status: DC | PRN
  Administered 2015-07-14: 15 mL via INTRAOCULAR

## 2015-07-14 MED ORDER — PHENYLEPHRINE-KETOROLAC 1-0.3 % IO SOLN
INTRAOCULAR | Status: DC | PRN
  Administered 2015-07-14: 500 mL via OPHTHALMIC

## 2015-07-14 MED ORDER — CYCLOPENTOLATE-PHENYLEPHRINE OP SOLN OPTIME - NO CHARGE
OPHTHALMIC | Status: AC
  Filled 2015-07-14: qty 2

## 2015-07-14 MED ORDER — TETRACAINE HCL 0.5 % OP SOLN
1.0000 [drp] | OPHTHALMIC | Status: AC
  Administered 2015-07-14 (×3): 1 [drp] via OPHTHALMIC

## 2015-07-14 MED ORDER — TETRACAINE 0.5 % OP SOLN OPTIME - NO CHARGE
OPHTHALMIC | Status: DC | PRN
  Administered 2015-07-14: 2 [drp] via OPHTHALMIC

## 2015-07-14 MED ORDER — EPINEPHRINE HCL 1 MG/ML IJ SOLN
INTRAMUSCULAR | Status: AC
  Filled 2015-07-14: qty 1

## 2015-07-14 MED ORDER — POVIDONE-IODINE 5 % OP SOLN
OPHTHALMIC | Status: DC | PRN
  Administered 2015-07-14: 1 via OPHTHALMIC

## 2015-07-14 MED ORDER — MIDAZOLAM HCL 2 MG/2ML IJ SOLN
1.0000 mg | INTRAMUSCULAR | Status: DC | PRN
  Administered 2015-07-14: 2 mg via INTRAVENOUS

## 2015-07-14 MED ORDER — FENTANYL CITRATE (PF) 100 MCG/2ML IJ SOLN: 25.0000 ug | INTRAMUSCULAR | Status: DC | PRN

## 2015-07-14 MED ORDER — FENTANYL CITRATE (PF) 100 MCG/2ML IJ SOLN
25.0000 ug | INTRAMUSCULAR | Status: AC
  Administered 2015-07-14 (×2): 25 ug via INTRAVENOUS

## 2015-07-14 MED ORDER — LACTATED RINGERS IV SOLN
INTRAVENOUS | Status: DC
  Administered 2015-07-14: 11:00:00 via INTRAVENOUS

## 2015-07-14 MED ORDER — PHENYLEPHRINE-KETOROLAC 1-0.3 % IO SOLN
INTRAOCULAR | Status: AC
  Filled 2015-07-14: qty 4

## 2015-07-14 MED ORDER — LIDOCAINE HCL 3.5 % OP GEL
OPHTHALMIC | Status: DC | PRN
  Administered 2015-07-14: 1 via OPHTHALMIC

## 2015-07-14 MED ORDER — TETRACAINE HCL 0.5 % OP SOLN
OPHTHALMIC | Status: AC
  Filled 2015-07-14: qty 4

## 2015-07-14 MED ORDER — MIDAZOLAM HCL 2 MG/2ML IJ SOLN
INTRAMUSCULAR | Status: AC
  Filled 2015-07-14: qty 2

## 2015-07-14 MED ORDER — ONDANSETRON HCL 4 MG/2ML IJ SOLN: 4.0000 mg | Freq: Once | INTRAMUSCULAR | Status: DC | PRN

## 2015-07-14 MED ORDER — LIDOCAINE HCL 3.5 % OP GEL
OPHTHALMIC | Status: AC
  Filled 2015-07-14: qty 1

## 2015-07-14 MED ORDER — CYCLOPENTOLATE-PHENYLEPHRINE 0.2-1 % OP SOLN
1.0000 [drp] | OPHTHALMIC | Status: AC
  Administered 2015-07-14 (×3): 1 [drp] via OPHTHALMIC

## 2015-07-14 SURGICAL SUPPLY — 28 items
CAPSULAR TENSION RING-AMO (OPHTHALMIC RELATED) IMPLANT
CLOTH BEACON ORANGE TIMEOUT ST (SAFETY) ×2 IMPLANT
GLOVE BIO SURGEON STRL SZ7.5 (GLOVE) IMPLANT
GLOVE BIOGEL M 6.5 STRL (GLOVE) ×2 IMPLANT
GLOVE BIOGEL PI IND STRL 6.5 (GLOVE) IMPLANT
GLOVE BIOGEL PI IND STRL 7.0 (GLOVE) IMPLANT
GLOVE BIOGEL PI INDICATOR 6.5 (GLOVE)
GLOVE BIOGEL PI INDICATOR 7.0 (GLOVE)
GLOVE ECLIPSE 6.5 STRL STRAW (GLOVE) IMPLANT
GLOVE ECLIPSE 7.5 STRL STRAW (GLOVE) IMPLANT
GLOVE EXAM NITRILE LRG STRL (GLOVE) IMPLANT
GLOVE EXAM NITRILE MD LF STRL (GLOVE) ×2 IMPLANT
GLOVE SKINSENSE NS SZ6.5 (GLOVE)
GLOVE SKINSENSE NS SZ7.0 (GLOVE)
GLOVE SKINSENSE STRL SZ6.5 (GLOVE) IMPLANT
GLOVE SKINSENSE STRL SZ7.0 (GLOVE) IMPLANT
INST SET CATARACT ~~LOC~~ (KITS) ×3 IMPLANT
KIT VITRECTOMY (OPHTHALMIC RELATED) IMPLANT
LENS ALC ACRYL/TECN (Ophthalmic Related) ×3 IMPLANT
PAD ARMBOARD 7.5X6 YLW CONV (MISCELLANEOUS) ×2 IMPLANT
PROC W NO LENS (INTRAOCULAR LENS)
PROC W SPEC LENS (INTRAOCULAR LENS)
PROCESS W NO LENS (INTRAOCULAR LENS) IMPLANT
PROCESS W SPEC LENS (INTRAOCULAR LENS) IMPLANT
RETRACTOR IRIS SIGHTPATH (OPHTHALMIC RELATED) IMPLANT
RING MALYGIN (MISCELLANEOUS) IMPLANT
VISCOELASTIC ADDITIONAL (OPHTHALMIC RELATED) IMPLANT
WATER STERILE IRR 250ML POUR (IV SOLUTION) ×2 IMPLANT

## 2015-07-14 NOTE — Op Note (Signed)
07/14/2015  11:45 AM  PATIENT:  Vincent Gaines  69 y.o. male  PRE-OPERATIVE DIAGNOSIS:  nuclear cataract left eye  POST-OPERATIVE DIAGNOSIS:  nuclear cataract left eye  PROCEDURE:  Procedure(s): CATARACT EXTRACTION PHACO AND INTRAOCULAR LENS PLACEMENT ([place])  SURGEON:  Surgeon(s): Williams Che, MD  ASSISTANTS: Zoila Shutter, CST   ANESTHESIA STAFF: Anesthesiologist: Lerry Liner, MD CRNA: Ollen Bowl, CRNA  ANESTHESIA:   topical and MAC  REQUESTED LENS POWER: 21.0  LENS IMPLANT INFORMATION:   Alcon SN60WF  S/n SN:[id number]  Exp 02/2020  CUMULATIVE DISSIPATED ENERGY:5.56  INDICATIONS:see scanned office H&P for particulars  OP FINDINGS:dense NS  COMPLICATIONS:None  PROCEDURE:  The patient was brought to the operating room in good condition.  The operative eye was prepped and draped in the usual fashion for intraocular surgery.  Lidocaine gel was dropped onto the eye.  A 2.4 mm 10 O'clock near clear corneal stepped incision and a 12 O'clock stab incision were created.  Viscoat was instilled into the anterior chamber.  The 5 mm anterior capsulorhexis was performed with a bent needle cystotome and Utrata forceps.  The lens was hydrodissected and hydrodelineated with a cannula and balanced salt solution and rotated with a Kuglen hook.  Phacoemulsification was perfomed in the divide and conquer technique.  The remaining cortex was removed with I&A and the capsular surfaces polished as necessary.  Provisc was placed into the capsular bag and the lens inserted with the Alcon inserter.  The viscoelastic was removed with I&A and the lens "rocked" into position.  The wounds were hydrated and te anterior chamber was refilled with balanced salt solution.  The wounds were checked for leakage and rehydrated as necessary.  The lid speculum and drapes were removed and the patient was transported to short stay in good condition.  PATIENT DISPOSITION:  Short Stay

## 2015-07-14 NOTE — Discharge Instructions (Signed)

## 2015-07-14 NOTE — Anesthesia Postprocedure Evaluation (Signed)
Anesthesia Post Note  Patient: Vincent Gaines  Procedure(s) Performed: Procedure(s) (LRB): CATARACT EXTRACTION PHACO AND INTRAOCULAR LENS PLACEMENT (IOC) (Left)  Patient location during evaluation: Short Stay Anesthesia Type: MAC Level of consciousness: awake and alert and oriented Pain management: pain level controlled Vital Signs Assessment: post-procedure vital signs reviewed and stable Respiratory status: spontaneous breathing Cardiovascular status: blood pressure returned to baseline Postop Assessment: adequate PO intake Anesthetic complications: no    Last Vitals:  Filed Vitals:   07/14/15 1100 07/14/15 1105  BP: 133/78 133/78  Pulse:    Resp: 16 15    Last Pain: There were no vitals filed for this visit.               Octavious Zidek

## 2015-07-14 NOTE — Transfer of Care (Signed)
Immediate Anesthesia Transfer of Care Note  Patient: Vincent Gaines  Procedure(s) Performed: Procedure(s) with comments: CATARACT EXTRACTION PHACO AND INTRAOCULAR LENS PLACEMENT (IOC) (Left) - CDE:5.56  Patient Location: Short Stay  Anesthesia Type:MAC  Level of Consciousness: awake  Airway & Oxygen Therapy: Patient Spontanous Breathing  Post-op Assessment: Report given to RN  Post vital signs: Reviewed  Last Vitals:  Filed Vitals:   07/14/15 1100 07/14/15 1105  BP: 133/78 133/78  Pulse:    Resp: 16 15    Complications: No apparent anesthesia complications

## 2015-07-14 NOTE — Anesthesia Preprocedure Evaluation (Signed)
Anesthesia Evaluation  Patient identified by MRN, date of birth, ID band Patient awake    Reviewed: Allergy & Precautions, H&P , NPO status , Patient's Chart, lab work & pertinent test results  Airway Mallampati: II  TM Distance: >3 FB Neck ROM: Full    Dental no notable dental hx. (+) Teeth Intact   Pulmonary neg pulmonary ROS, former smoker,    Pulmonary exam normal breath sounds clear to auscultation       Cardiovascular negative cardio ROS Normal cardiovascular exam Rhythm:Regular Rate:Normal     Neuro/Psych Anxiety    GI/Hepatic negative GI ROS,   Endo/Other    Renal/GU   negative genitourinary   Musculoskeletal   Abdominal   Peds  Hematology   Anesthesia Other Findings   Reproductive/Obstetrics                             Anesthesia Physical Anesthesia Plan  ASA: II  Anesthesia Plan: MAC   Post-op Pain Management:    Induction: Intravenous  Airway Management Planned: Nasal Cannula  Additional Equipment:   Intra-op Plan:   Post-operative Plan:   Informed Consent: I have reviewed the patients History and Physical, chart, labs and discussed the procedure including the risks, benefits and alternatives for the proposed anesthesia with the patient or authorized representative who has indicated his/her understanding and acceptance.     Plan Discussed with:   Anesthesia Plan Comments:         Anesthesia Quick Evaluation

## 2015-07-14 NOTE — Brief Op Note (Signed)
07/14/2015  11:45 AM  PATIENT:  Milus Banister  69 y.o. male  PRE-OPERATIVE DIAGNOSIS:  nuclear cataract left eye  POST-OPERATIVE DIAGNOSIS:  nuclear cataract left eye  PROCEDURE:  Procedure(s): CATARACT EXTRACTION PHACO AND INTRAOCULAR LENS PLACEMENT ([place])  SURGEON:  Surgeon(s): Williams Che, MD  ASSISTANTS: Zoila Shutter, CST   ANESTHESIA STAFF: Anesthesiologist: Lerry Liner, MD CRNA: Ollen Bowl, CRNA  ANESTHESIA:   topical and MAC  REQUESTED LENS POWER: 21.0  LENS IMPLANT INFORMATION:   Alcon [id number]  S/n [id number]  Exp 02/2020  CUMULATIVE DISSIPATED ENERGY:5.56  INDICATIONS:see scanned office H&P for particulars  OP FINDINGS:dense NS  COMPLICATIONS:None  DICTATION #: none  PLAN OF CARE: as above  PATIENT DISPOSITION:  Short Stay

## 2015-07-14 NOTE — H&P (Signed)
I have reviewed the pre printed H&P, the patient was re-examined, and I have identified no significant interval changes in the patient's medical condition.  There is no change in the plan of care since the history and physical of record. 

## 2015-07-15 ENCOUNTER — Encounter (HOSPITAL_COMMUNITY): Payer: Self-pay | Admitting: Ophthalmology

## 2015-09-09 DIAGNOSIS — H43813 Vitreous degeneration, bilateral: Secondary | ICD-10-CM | POA: Diagnosis not present

## 2015-09-09 DIAGNOSIS — H353112 Nonexudative age-related macular degeneration, right eye, intermediate dry stage: Secondary | ICD-10-CM | POA: Diagnosis not present

## 2015-09-09 DIAGNOSIS — H35373 Puckering of macula, bilateral: Secondary | ICD-10-CM | POA: Diagnosis not present

## 2015-09-09 DIAGNOSIS — H353221 Exudative age-related macular degeneration, left eye, with active choroidal neovascularization: Secondary | ICD-10-CM | POA: Diagnosis not present

## 2015-10-28 DIAGNOSIS — Z125 Encounter for screening for malignant neoplasm of prostate: Secondary | ICD-10-CM | POA: Diagnosis not present

## 2015-10-28 DIAGNOSIS — R7301 Impaired fasting glucose: Secondary | ICD-10-CM | POA: Diagnosis not present

## 2015-10-28 DIAGNOSIS — E782 Mixed hyperlipidemia: Secondary | ICD-10-CM | POA: Diagnosis not present

## 2015-10-30 DIAGNOSIS — I10 Essential (primary) hypertension: Secondary | ICD-10-CM | POA: Diagnosis not present

## 2015-10-30 DIAGNOSIS — Z96659 Presence of unspecified artificial knee joint: Secondary | ICD-10-CM | POA: Diagnosis not present

## 2015-10-30 DIAGNOSIS — H3530 Unspecified macular degeneration: Secondary | ICD-10-CM | POA: Diagnosis not present

## 2015-12-02 DIAGNOSIS — H43813 Vitreous degeneration, bilateral: Secondary | ICD-10-CM | POA: Diagnosis not present

## 2015-12-02 DIAGNOSIS — H353112 Nonexudative age-related macular degeneration, right eye, intermediate dry stage: Secondary | ICD-10-CM | POA: Diagnosis not present

## 2015-12-02 DIAGNOSIS — H35373 Puckering of macula, bilateral: Secondary | ICD-10-CM | POA: Diagnosis not present

## 2015-12-02 DIAGNOSIS — H353221 Exudative age-related macular degeneration, left eye, with active choroidal neovascularization: Secondary | ICD-10-CM | POA: Diagnosis not present

## 2016-02-25 DIAGNOSIS — H353221 Exudative age-related macular degeneration, left eye, with active choroidal neovascularization: Secondary | ICD-10-CM | POA: Diagnosis not present

## 2016-02-25 DIAGNOSIS — H353112 Nonexudative age-related macular degeneration, right eye, intermediate dry stage: Secondary | ICD-10-CM | POA: Diagnosis not present

## 2016-02-25 DIAGNOSIS — H35422 Microcystoid degeneration of retina, left eye: Secondary | ICD-10-CM | POA: Diagnosis not present

## 2016-02-25 DIAGNOSIS — H35373 Puckering of macula, bilateral: Secondary | ICD-10-CM | POA: Diagnosis not present

## 2016-04-06 DIAGNOSIS — H538 Other visual disturbances: Secondary | ICD-10-CM | POA: Diagnosis not present

## 2016-04-06 DIAGNOSIS — Z961 Presence of intraocular lens: Secondary | ICD-10-CM | POA: Diagnosis not present

## 2016-04-06 DIAGNOSIS — H353230 Exudative age-related macular degeneration, bilateral, stage unspecified: Secondary | ICD-10-CM | POA: Diagnosis not present

## 2016-04-08 DIAGNOSIS — Z961 Presence of intraocular lens: Secondary | ICD-10-CM | POA: Diagnosis not present

## 2016-05-04 DIAGNOSIS — R7301 Impaired fasting glucose: Secondary | ICD-10-CM | POA: Diagnosis not present

## 2016-05-04 DIAGNOSIS — E785 Hyperlipidemia, unspecified: Secondary | ICD-10-CM | POA: Diagnosis not present

## 2016-05-06 DIAGNOSIS — R7301 Impaired fasting glucose: Secondary | ICD-10-CM | POA: Diagnosis not present

## 2016-05-06 DIAGNOSIS — E875 Hyperkalemia: Secondary | ICD-10-CM | POA: Diagnosis not present

## 2016-05-06 DIAGNOSIS — Z23 Encounter for immunization: Secondary | ICD-10-CM | POA: Diagnosis not present

## 2016-05-06 DIAGNOSIS — I10 Essential (primary) hypertension: Secondary | ICD-10-CM | POA: Diagnosis not present

## 2016-05-06 DIAGNOSIS — E785 Hyperlipidemia, unspecified: Secondary | ICD-10-CM | POA: Diagnosis not present

## 2016-05-06 DIAGNOSIS — Z720 Tobacco use: Secondary | ICD-10-CM | POA: Diagnosis not present

## 2016-05-06 DIAGNOSIS — Z0000 Encounter for general adult medical examination without abnormal findings: Secondary | ICD-10-CM | POA: Diagnosis not present

## 2016-05-19 DIAGNOSIS — H35373 Puckering of macula, bilateral: Secondary | ICD-10-CM | POA: Diagnosis not present

## 2016-05-19 DIAGNOSIS — H35422 Microcystoid degeneration of retina, left eye: Secondary | ICD-10-CM | POA: Diagnosis not present

## 2016-05-19 DIAGNOSIS — H353221 Exudative age-related macular degeneration, left eye, with active choroidal neovascularization: Secondary | ICD-10-CM | POA: Diagnosis not present

## 2016-05-19 DIAGNOSIS — H353112 Nonexudative age-related macular degeneration, right eye, intermediate dry stage: Secondary | ICD-10-CM | POA: Diagnosis not present

## 2016-08-11 DIAGNOSIS — H353221 Exudative age-related macular degeneration, left eye, with active choroidal neovascularization: Secondary | ICD-10-CM | POA: Diagnosis not present

## 2016-08-11 DIAGNOSIS — H35373 Puckering of macula, bilateral: Secondary | ICD-10-CM | POA: Diagnosis not present

## 2016-08-11 DIAGNOSIS — H35422 Microcystoid degeneration of retina, left eye: Secondary | ICD-10-CM | POA: Diagnosis not present

## 2016-08-11 DIAGNOSIS — H353111 Nonexudative age-related macular degeneration, right eye, early dry stage: Secondary | ICD-10-CM | POA: Diagnosis not present

## 2016-08-18 DIAGNOSIS — H26493 Other secondary cataract, bilateral: Secondary | ICD-10-CM | POA: Diagnosis not present

## 2016-08-18 DIAGNOSIS — Z961 Presence of intraocular lens: Secondary | ICD-10-CM | POA: Diagnosis not present

## 2016-08-18 DIAGNOSIS — Q829 Congenital malformation of skin, unspecified: Secondary | ICD-10-CM | POA: Diagnosis not present

## 2016-10-08 DIAGNOSIS — H26493 Other secondary cataract, bilateral: Secondary | ICD-10-CM | POA: Diagnosis not present

## 2016-10-08 DIAGNOSIS — Z961 Presence of intraocular lens: Secondary | ICD-10-CM | POA: Diagnosis not present

## 2016-10-27 DIAGNOSIS — H353221 Exudative age-related macular degeneration, left eye, with active choroidal neovascularization: Secondary | ICD-10-CM | POA: Diagnosis not present

## 2016-10-27 DIAGNOSIS — H35422 Microcystoid degeneration of retina, left eye: Secondary | ICD-10-CM | POA: Diagnosis not present

## 2016-10-27 DIAGNOSIS — H35373 Puckering of macula, bilateral: Secondary | ICD-10-CM | POA: Diagnosis not present

## 2016-10-27 DIAGNOSIS — H353111 Nonexudative age-related macular degeneration, right eye, early dry stage: Secondary | ICD-10-CM | POA: Diagnosis not present

## 2016-11-02 DIAGNOSIS — E785 Hyperlipidemia, unspecified: Secondary | ICD-10-CM | POA: Diagnosis not present

## 2016-11-02 DIAGNOSIS — R7301 Impaired fasting glucose: Secondary | ICD-10-CM | POA: Diagnosis not present

## 2016-11-04 DIAGNOSIS — E875 Hyperkalemia: Secondary | ICD-10-CM | POA: Diagnosis not present

## 2016-11-04 DIAGNOSIS — I10 Essential (primary) hypertension: Secondary | ICD-10-CM | POA: Diagnosis not present

## 2016-11-04 DIAGNOSIS — E785 Hyperlipidemia, unspecified: Secondary | ICD-10-CM | POA: Diagnosis not present

## 2016-11-04 DIAGNOSIS — R7301 Impaired fasting glucose: Secondary | ICD-10-CM | POA: Diagnosis not present

## 2016-11-04 DIAGNOSIS — Z720 Tobacco use: Secondary | ICD-10-CM | POA: Diagnosis not present

## 2016-11-26 DIAGNOSIS — Z961 Presence of intraocular lens: Secondary | ICD-10-CM | POA: Diagnosis not present

## 2016-11-26 DIAGNOSIS — H26491 Other secondary cataract, right eye: Secondary | ICD-10-CM | POA: Diagnosis not present

## 2017-01-26 DIAGNOSIS — H35422 Microcystoid degeneration of retina, left eye: Secondary | ICD-10-CM | POA: Diagnosis not present

## 2017-01-26 DIAGNOSIS — H43813 Vitreous degeneration, bilateral: Secondary | ICD-10-CM | POA: Diagnosis not present

## 2017-01-26 DIAGNOSIS — H353111 Nonexudative age-related macular degeneration, right eye, early dry stage: Secondary | ICD-10-CM | POA: Diagnosis not present

## 2017-01-26 DIAGNOSIS — H353221 Exudative age-related macular degeneration, left eye, with active choroidal neovascularization: Secondary | ICD-10-CM | POA: Diagnosis not present

## 2017-03-10 DIAGNOSIS — H33312 Horseshoe tear of retina without detachment, left eye: Secondary | ICD-10-CM | POA: Diagnosis not present

## 2017-03-10 DIAGNOSIS — H35372 Puckering of macula, left eye: Secondary | ICD-10-CM | POA: Diagnosis not present

## 2017-03-10 DIAGNOSIS — H3581 Retinal edema: Secondary | ICD-10-CM | POA: Diagnosis not present

## 2017-03-10 DIAGNOSIS — H35352 Cystoid macular degeneration, left eye: Secondary | ICD-10-CM | POA: Diagnosis not present

## 2017-03-11 DIAGNOSIS — H35372 Puckering of macula, left eye: Secondary | ICD-10-CM | POA: Diagnosis not present

## 2017-03-22 DIAGNOSIS — H3581 Retinal edema: Secondary | ICD-10-CM | POA: Diagnosis not present

## 2017-03-22 DIAGNOSIS — H35373 Puckering of macula, bilateral: Secondary | ICD-10-CM | POA: Diagnosis not present

## 2017-04-20 DIAGNOSIS — H353221 Exudative age-related macular degeneration, left eye, with active choroidal neovascularization: Secondary | ICD-10-CM | POA: Diagnosis not present

## 2017-04-20 DIAGNOSIS — H35371 Puckering of macula, right eye: Secondary | ICD-10-CM | POA: Diagnosis not present

## 2017-04-20 DIAGNOSIS — D3131 Benign neoplasm of right choroid: Secondary | ICD-10-CM | POA: Diagnosis not present

## 2017-04-20 DIAGNOSIS — H353111 Nonexudative age-related macular degeneration, right eye, early dry stage: Secondary | ICD-10-CM | POA: Diagnosis not present

## 2017-05-05 DIAGNOSIS — I10 Essential (primary) hypertension: Secondary | ICD-10-CM | POA: Diagnosis not present

## 2017-05-05 DIAGNOSIS — R7301 Impaired fasting glucose: Secondary | ICD-10-CM | POA: Diagnosis not present

## 2017-05-09 DIAGNOSIS — E875 Hyperkalemia: Secondary | ICD-10-CM | POA: Diagnosis not present

## 2017-05-09 DIAGNOSIS — I10 Essential (primary) hypertension: Secondary | ICD-10-CM | POA: Diagnosis not present

## 2017-05-09 DIAGNOSIS — R7301 Impaired fasting glucose: Secondary | ICD-10-CM | POA: Diagnosis not present

## 2017-05-09 DIAGNOSIS — E785 Hyperlipidemia, unspecified: Secondary | ICD-10-CM | POA: Diagnosis not present

## 2017-05-09 DIAGNOSIS — Z720 Tobacco use: Secondary | ICD-10-CM | POA: Diagnosis not present

## 2017-05-18 DIAGNOSIS — H3530 Unspecified macular degeneration: Secondary | ICD-10-CM | POA: Diagnosis not present

## 2017-05-18 DIAGNOSIS — Z961 Presence of intraocular lens: Secondary | ICD-10-CM | POA: Diagnosis not present

## 2017-05-18 DIAGNOSIS — Z9842 Cataract extraction status, left eye: Secondary | ICD-10-CM | POA: Diagnosis not present

## 2017-05-18 DIAGNOSIS — Q829 Congenital malformation of skin, unspecified: Secondary | ICD-10-CM | POA: Diagnosis not present

## 2017-05-18 DIAGNOSIS — Z9841 Cataract extraction status, right eye: Secondary | ICD-10-CM | POA: Diagnosis not present

## 2017-06-06 DIAGNOSIS — I10 Essential (primary) hypertension: Secondary | ICD-10-CM | POA: Diagnosis not present

## 2017-06-06 DIAGNOSIS — E875 Hyperkalemia: Secondary | ICD-10-CM | POA: Diagnosis not present

## 2017-06-22 DIAGNOSIS — Q829 Congenital malformation of skin, unspecified: Secondary | ICD-10-CM | POA: Diagnosis not present

## 2017-06-22 DIAGNOSIS — Z9841 Cataract extraction status, right eye: Secondary | ICD-10-CM | POA: Diagnosis not present

## 2017-06-22 DIAGNOSIS — Z961 Presence of intraocular lens: Secondary | ICD-10-CM | POA: Diagnosis not present

## 2017-06-22 DIAGNOSIS — Z9842 Cataract extraction status, left eye: Secondary | ICD-10-CM | POA: Diagnosis not present

## 2017-08-23 DIAGNOSIS — H43811 Vitreous degeneration, right eye: Secondary | ICD-10-CM | POA: Diagnosis not present

## 2017-08-23 DIAGNOSIS — H35422 Microcystoid degeneration of retina, left eye: Secondary | ICD-10-CM | POA: Diagnosis not present

## 2017-08-23 DIAGNOSIS — H353221 Exudative age-related macular degeneration, left eye, with active choroidal neovascularization: Secondary | ICD-10-CM | POA: Diagnosis not present

## 2017-08-23 DIAGNOSIS — H35371 Puckering of macula, right eye: Secondary | ICD-10-CM | POA: Diagnosis not present

## 2017-09-09 DIAGNOSIS — H353221 Exudative age-related macular degeneration, left eye, with active choroidal neovascularization: Secondary | ICD-10-CM | POA: Diagnosis not present

## 2017-10-12 DIAGNOSIS — H353221 Exudative age-related macular degeneration, left eye, with active choroidal neovascularization: Secondary | ICD-10-CM | POA: Diagnosis not present

## 2017-10-12 DIAGNOSIS — H35371 Puckering of macula, right eye: Secondary | ICD-10-CM | POA: Diagnosis not present

## 2017-10-12 DIAGNOSIS — H43811 Vitreous degeneration, right eye: Secondary | ICD-10-CM | POA: Diagnosis not present

## 2017-10-12 DIAGNOSIS — H353112 Nonexudative age-related macular degeneration, right eye, intermediate dry stage: Secondary | ICD-10-CM | POA: Diagnosis not present

## 2017-10-27 DIAGNOSIS — E875 Hyperkalemia: Secondary | ICD-10-CM | POA: Diagnosis not present

## 2017-10-27 DIAGNOSIS — Z125 Encounter for screening for malignant neoplasm of prostate: Secondary | ICD-10-CM | POA: Diagnosis not present

## 2017-10-27 DIAGNOSIS — I10 Essential (primary) hypertension: Secondary | ICD-10-CM | POA: Diagnosis not present

## 2017-10-27 DIAGNOSIS — R7301 Impaired fasting glucose: Secondary | ICD-10-CM | POA: Diagnosis not present

## 2017-10-31 DIAGNOSIS — E875 Hyperkalemia: Secondary | ICD-10-CM | POA: Diagnosis not present

## 2017-10-31 DIAGNOSIS — E782 Mixed hyperlipidemia: Secondary | ICD-10-CM | POA: Diagnosis not present

## 2017-10-31 DIAGNOSIS — I10 Essential (primary) hypertension: Secondary | ICD-10-CM | POA: Diagnosis not present

## 2017-10-31 DIAGNOSIS — Z720 Tobacco use: Secondary | ICD-10-CM | POA: Diagnosis not present

## 2017-10-31 DIAGNOSIS — R7301 Impaired fasting glucose: Secondary | ICD-10-CM | POA: Diagnosis not present

## 2017-11-11 DIAGNOSIS — H35371 Puckering of macula, right eye: Secondary | ICD-10-CM | POA: Diagnosis not present

## 2017-11-11 DIAGNOSIS — D3131 Benign neoplasm of right choroid: Secondary | ICD-10-CM | POA: Diagnosis not present

## 2017-11-11 DIAGNOSIS — H353221 Exudative age-related macular degeneration, left eye, with active choroidal neovascularization: Secondary | ICD-10-CM | POA: Diagnosis not present

## 2017-11-11 DIAGNOSIS — H353111 Nonexudative age-related macular degeneration, right eye, early dry stage: Secondary | ICD-10-CM | POA: Diagnosis not present

## 2017-12-09 DIAGNOSIS — H3581 Retinal edema: Secondary | ICD-10-CM | POA: Diagnosis not present

## 2017-12-09 DIAGNOSIS — H35371 Puckering of macula, right eye: Secondary | ICD-10-CM | POA: Diagnosis not present

## 2017-12-09 DIAGNOSIS — H353111 Nonexudative age-related macular degeneration, right eye, early dry stage: Secondary | ICD-10-CM | POA: Diagnosis not present

## 2017-12-09 DIAGNOSIS — H353221 Exudative age-related macular degeneration, left eye, with active choroidal neovascularization: Secondary | ICD-10-CM | POA: Diagnosis not present

## 2017-12-22 DIAGNOSIS — H35371 Puckering of macula, right eye: Secondary | ICD-10-CM | POA: Diagnosis not present

## 2017-12-22 DIAGNOSIS — H18452 Nodular corneal degeneration, left eye: Secondary | ICD-10-CM | POA: Diagnosis not present

## 2017-12-22 DIAGNOSIS — M3501 Sicca syndrome with keratoconjunctivitis: Secondary | ICD-10-CM | POA: Diagnosis not present

## 2018-01-06 DIAGNOSIS — H353111 Nonexudative age-related macular degeneration, right eye, early dry stage: Secondary | ICD-10-CM | POA: Diagnosis not present

## 2018-01-06 DIAGNOSIS — H35371 Puckering of macula, right eye: Secondary | ICD-10-CM | POA: Diagnosis not present

## 2018-01-06 DIAGNOSIS — H353221 Exudative age-related macular degeneration, left eye, with active choroidal neovascularization: Secondary | ICD-10-CM | POA: Diagnosis not present

## 2018-01-06 DIAGNOSIS — D3131 Benign neoplasm of right choroid: Secondary | ICD-10-CM | POA: Diagnosis not present

## 2018-02-03 DIAGNOSIS — H353221 Exudative age-related macular degeneration, left eye, with active choroidal neovascularization: Secondary | ICD-10-CM | POA: Diagnosis not present

## 2018-02-03 DIAGNOSIS — H353112 Nonexudative age-related macular degeneration, right eye, intermediate dry stage: Secondary | ICD-10-CM | POA: Diagnosis not present

## 2018-02-03 DIAGNOSIS — H35371 Puckering of macula, right eye: Secondary | ICD-10-CM | POA: Diagnosis not present

## 2018-02-03 DIAGNOSIS — D3131 Benign neoplasm of right choroid: Secondary | ICD-10-CM | POA: Diagnosis not present

## 2018-02-08 DIAGNOSIS — M3501 Sicca syndrome with keratoconjunctivitis: Secondary | ICD-10-CM | POA: Diagnosis not present

## 2018-02-08 DIAGNOSIS — H353112 Nonexudative age-related macular degeneration, right eye, intermediate dry stage: Secondary | ICD-10-CM | POA: Diagnosis not present

## 2018-02-08 DIAGNOSIS — H353221 Exudative age-related macular degeneration, left eye, with active choroidal neovascularization: Secondary | ICD-10-CM | POA: Diagnosis not present

## 2018-03-03 DIAGNOSIS — H353112 Nonexudative age-related macular degeneration, right eye, intermediate dry stage: Secondary | ICD-10-CM | POA: Diagnosis not present

## 2018-03-03 DIAGNOSIS — D3131 Benign neoplasm of right choroid: Secondary | ICD-10-CM | POA: Diagnosis not present

## 2018-03-03 DIAGNOSIS — H35371 Puckering of macula, right eye: Secondary | ICD-10-CM | POA: Diagnosis not present

## 2018-03-03 DIAGNOSIS — H353221 Exudative age-related macular degeneration, left eye, with active choroidal neovascularization: Secondary | ICD-10-CM | POA: Diagnosis not present

## 2018-04-05 DIAGNOSIS — H35422 Microcystoid degeneration of retina, left eye: Secondary | ICD-10-CM | POA: Diagnosis not present

## 2018-04-05 DIAGNOSIS — H353221 Exudative age-related macular degeneration, left eye, with active choroidal neovascularization: Secondary | ICD-10-CM | POA: Diagnosis not present

## 2018-04-05 DIAGNOSIS — H35371 Puckering of macula, right eye: Secondary | ICD-10-CM | POA: Diagnosis not present

## 2018-04-05 DIAGNOSIS — H353112 Nonexudative age-related macular degeneration, right eye, intermediate dry stage: Secondary | ICD-10-CM | POA: Diagnosis not present

## 2018-04-28 DIAGNOSIS — R7301 Impaired fasting glucose: Secondary | ICD-10-CM | POA: Diagnosis not present

## 2018-04-28 DIAGNOSIS — E782 Mixed hyperlipidemia: Secondary | ICD-10-CM | POA: Diagnosis not present

## 2018-04-28 DIAGNOSIS — I10 Essential (primary) hypertension: Secondary | ICD-10-CM | POA: Diagnosis not present

## 2018-05-02 DIAGNOSIS — Z0001 Encounter for general adult medical examination with abnormal findings: Secondary | ICD-10-CM | POA: Diagnosis not present

## 2018-05-02 DIAGNOSIS — E875 Hyperkalemia: Secondary | ICD-10-CM | POA: Diagnosis not present

## 2018-05-02 DIAGNOSIS — R7301 Impaired fasting glucose: Secondary | ICD-10-CM | POA: Diagnosis not present

## 2018-05-02 DIAGNOSIS — Z23 Encounter for immunization: Secondary | ICD-10-CM | POA: Diagnosis not present

## 2018-05-02 DIAGNOSIS — I10 Essential (primary) hypertension: Secondary | ICD-10-CM | POA: Diagnosis not present

## 2018-05-03 DIAGNOSIS — H35371 Puckering of macula, right eye: Secondary | ICD-10-CM | POA: Diagnosis not present

## 2018-05-03 DIAGNOSIS — H353112 Nonexudative age-related macular degeneration, right eye, intermediate dry stage: Secondary | ICD-10-CM | POA: Diagnosis not present

## 2018-05-03 DIAGNOSIS — D3131 Benign neoplasm of right choroid: Secondary | ICD-10-CM | POA: Diagnosis not present

## 2018-05-03 DIAGNOSIS — H353221 Exudative age-related macular degeneration, left eye, with active choroidal neovascularization: Secondary | ICD-10-CM | POA: Diagnosis not present

## 2018-05-23 DIAGNOSIS — I10 Essential (primary) hypertension: Secondary | ICD-10-CM | POA: Diagnosis not present

## 2018-05-23 DIAGNOSIS — Z720 Tobacco use: Secondary | ICD-10-CM | POA: Diagnosis not present

## 2018-05-23 DIAGNOSIS — Z0001 Encounter for general adult medical examination with abnormal findings: Secondary | ICD-10-CM | POA: Diagnosis not present

## 2018-05-23 DIAGNOSIS — G4700 Insomnia, unspecified: Secondary | ICD-10-CM | POA: Diagnosis not present

## 2018-05-23 DIAGNOSIS — M170 Bilateral primary osteoarthritis of knee: Secondary | ICD-10-CM | POA: Diagnosis not present

## 2018-05-23 DIAGNOSIS — H3530 Unspecified macular degeneration: Secondary | ICD-10-CM | POA: Diagnosis not present

## 2018-05-23 DIAGNOSIS — M2550 Pain in unspecified joint: Secondary | ICD-10-CM | POA: Diagnosis not present

## 2018-05-23 DIAGNOSIS — E875 Hyperkalemia: Secondary | ICD-10-CM | POA: Diagnosis not present

## 2018-06-06 DIAGNOSIS — H353112 Nonexudative age-related macular degeneration, right eye, intermediate dry stage: Secondary | ICD-10-CM | POA: Diagnosis not present

## 2018-06-06 DIAGNOSIS — H353221 Exudative age-related macular degeneration, left eye, with active choroidal neovascularization: Secondary | ICD-10-CM | POA: Diagnosis not present

## 2018-06-06 DIAGNOSIS — H35371 Puckering of macula, right eye: Secondary | ICD-10-CM | POA: Diagnosis not present

## 2018-06-06 DIAGNOSIS — D3131 Benign neoplasm of right choroid: Secondary | ICD-10-CM | POA: Diagnosis not present

## 2018-07-12 DIAGNOSIS — H353221 Exudative age-related macular degeneration, left eye, with active choroidal neovascularization: Secondary | ICD-10-CM | POA: Diagnosis not present

## 2018-07-12 DIAGNOSIS — D3131 Benign neoplasm of right choroid: Secondary | ICD-10-CM | POA: Diagnosis not present

## 2018-07-12 DIAGNOSIS — H35371 Puckering of macula, right eye: Secondary | ICD-10-CM | POA: Diagnosis not present

## 2018-07-12 DIAGNOSIS — H353112 Nonexudative age-related macular degeneration, right eye, intermediate dry stage: Secondary | ICD-10-CM | POA: Diagnosis not present

## 2018-08-18 DIAGNOSIS — H353112 Nonexudative age-related macular degeneration, right eye, intermediate dry stage: Secondary | ICD-10-CM | POA: Diagnosis not present

## 2018-08-18 DIAGNOSIS — H353221 Exudative age-related macular degeneration, left eye, with active choroidal neovascularization: Secondary | ICD-10-CM | POA: Diagnosis not present

## 2018-08-18 DIAGNOSIS — D3131 Benign neoplasm of right choroid: Secondary | ICD-10-CM | POA: Diagnosis not present

## 2018-08-18 DIAGNOSIS — H35371 Puckering of macula, right eye: Secondary | ICD-10-CM | POA: Diagnosis not present

## 2018-09-27 DIAGNOSIS — H353221 Exudative age-related macular degeneration, left eye, with active choroidal neovascularization: Secondary | ICD-10-CM | POA: Diagnosis not present

## 2018-09-27 DIAGNOSIS — H3581 Retinal edema: Secondary | ICD-10-CM | POA: Diagnosis not present

## 2018-09-27 DIAGNOSIS — H35371 Puckering of macula, right eye: Secondary | ICD-10-CM | POA: Diagnosis not present

## 2018-09-27 DIAGNOSIS — H353112 Nonexudative age-related macular degeneration, right eye, intermediate dry stage: Secondary | ICD-10-CM | POA: Diagnosis not present

## 2018-11-02 DIAGNOSIS — H353221 Exudative age-related macular degeneration, left eye, with active choroidal neovascularization: Secondary | ICD-10-CM | POA: Diagnosis not present

## 2018-11-03 DIAGNOSIS — R7301 Impaired fasting glucose: Secondary | ICD-10-CM | POA: Diagnosis not present

## 2018-11-03 DIAGNOSIS — I10 Essential (primary) hypertension: Secondary | ICD-10-CM | POA: Diagnosis not present

## 2018-11-03 DIAGNOSIS — E782 Mixed hyperlipidemia: Secondary | ICD-10-CM | POA: Diagnosis not present

## 2018-11-06 DIAGNOSIS — R945 Abnormal results of liver function studies: Secondary | ICD-10-CM | POA: Diagnosis not present

## 2018-11-06 DIAGNOSIS — H3530 Unspecified macular degeneration: Secondary | ICD-10-CM | POA: Diagnosis not present

## 2018-11-06 DIAGNOSIS — I10 Essential (primary) hypertension: Secondary | ICD-10-CM | POA: Diagnosis not present

## 2018-12-07 DIAGNOSIS — R21 Rash and other nonspecific skin eruption: Secondary | ICD-10-CM | POA: Diagnosis not present

## 2018-12-08 DIAGNOSIS — H353221 Exudative age-related macular degeneration, left eye, with active choroidal neovascularization: Secondary | ICD-10-CM | POA: Diagnosis not present

## 2018-12-21 DIAGNOSIS — Z0000 Encounter for general adult medical examination without abnormal findings: Secondary | ICD-10-CM | POA: Diagnosis not present

## 2019-01-19 DIAGNOSIS — H353221 Exudative age-related macular degeneration, left eye, with active choroidal neovascularization: Secondary | ICD-10-CM | POA: Diagnosis not present

## 2019-03-02 DIAGNOSIS — H353112 Nonexudative age-related macular degeneration, right eye, intermediate dry stage: Secondary | ICD-10-CM | POA: Diagnosis not present

## 2019-03-02 DIAGNOSIS — H3581 Retinal edema: Secondary | ICD-10-CM | POA: Diagnosis not present

## 2019-03-02 DIAGNOSIS — H35371 Puckering of macula, right eye: Secondary | ICD-10-CM | POA: Diagnosis not present

## 2019-03-02 DIAGNOSIS — H353221 Exudative age-related macular degeneration, left eye, with active choroidal neovascularization: Secondary | ICD-10-CM | POA: Diagnosis not present

## 2019-04-17 DIAGNOSIS — H35371 Puckering of macula, right eye: Secondary | ICD-10-CM | POA: Diagnosis not present

## 2019-04-17 DIAGNOSIS — H43811 Vitreous degeneration, right eye: Secondary | ICD-10-CM | POA: Diagnosis not present

## 2019-04-17 DIAGNOSIS — H353112 Nonexudative age-related macular degeneration, right eye, intermediate dry stage: Secondary | ICD-10-CM | POA: Diagnosis not present

## 2019-04-17 DIAGNOSIS — H353221 Exudative age-related macular degeneration, left eye, with active choroidal neovascularization: Secondary | ICD-10-CM | POA: Diagnosis not present

## 2019-04-27 DIAGNOSIS — I10 Essential (primary) hypertension: Secondary | ICD-10-CM | POA: Diagnosis not present

## 2019-04-27 DIAGNOSIS — R7301 Impaired fasting glucose: Secondary | ICD-10-CM | POA: Diagnosis not present

## 2019-04-27 DIAGNOSIS — E782 Mixed hyperlipidemia: Secondary | ICD-10-CM | POA: Diagnosis not present

## 2019-05-03 DIAGNOSIS — L308 Other specified dermatitis: Secondary | ICD-10-CM | POA: Diagnosis not present

## 2019-05-03 DIAGNOSIS — L820 Inflamed seborrheic keratosis: Secondary | ICD-10-CM | POA: Diagnosis not present

## 2019-05-03 DIAGNOSIS — I872 Venous insufficiency (chronic) (peripheral): Secondary | ICD-10-CM | POA: Diagnosis not present

## 2019-05-09 DIAGNOSIS — I10 Essential (primary) hypertension: Secondary | ICD-10-CM | POA: Diagnosis not present

## 2019-05-09 DIAGNOSIS — R945 Abnormal results of liver function studies: Secondary | ICD-10-CM | POA: Diagnosis not present

## 2019-05-09 DIAGNOSIS — Z2821 Immunization not carried out because of patient refusal: Secondary | ICD-10-CM | POA: Diagnosis not present

## 2019-05-09 DIAGNOSIS — H3530 Unspecified macular degeneration: Secondary | ICD-10-CM | POA: Diagnosis not present

## 2019-06-01 DIAGNOSIS — H353112 Nonexudative age-related macular degeneration, right eye, intermediate dry stage: Secondary | ICD-10-CM | POA: Diagnosis not present

## 2019-06-01 DIAGNOSIS — H35371 Puckering of macula, right eye: Secondary | ICD-10-CM | POA: Diagnosis not present

## 2019-06-01 DIAGNOSIS — H43811 Vitreous degeneration, right eye: Secondary | ICD-10-CM | POA: Diagnosis not present

## 2019-06-01 DIAGNOSIS — H353221 Exudative age-related macular degeneration, left eye, with active choroidal neovascularization: Secondary | ICD-10-CM | POA: Diagnosis not present

## 2019-07-17 DIAGNOSIS — H353221 Exudative age-related macular degeneration, left eye, with active choroidal neovascularization: Secondary | ICD-10-CM | POA: Diagnosis not present

## 2019-07-17 DIAGNOSIS — D3131 Benign neoplasm of right choroid: Secondary | ICD-10-CM | POA: Diagnosis not present

## 2019-07-17 DIAGNOSIS — H353112 Nonexudative age-related macular degeneration, right eye, intermediate dry stage: Secondary | ICD-10-CM | POA: Diagnosis not present

## 2019-07-17 DIAGNOSIS — H35371 Puckering of macula, right eye: Secondary | ICD-10-CM | POA: Diagnosis not present

## 2019-08-01 ENCOUNTER — Ambulatory Visit
Admission: EM | Admit: 2019-08-01 | Discharge: 2019-08-01 | Disposition: A | Discharge status: Home / Self Care | Payer: Medicare Other | Attending: Urgent Care | Admitting: Urgent Care

## 2019-08-01 ENCOUNTER — Other Ambulatory Visit: Payer: Self-pay

## 2019-08-01 DIAGNOSIS — E785 Hyperlipidemia, unspecified: Secondary | ICD-10-CM

## 2019-08-01 DIAGNOSIS — L299 Pruritus, unspecified: Secondary | ICD-10-CM

## 2019-08-01 DIAGNOSIS — R21 Rash and other nonspecific skin eruption: Secondary | ICD-10-CM | POA: Diagnosis not present

## 2019-08-01 MED ORDER — FLUCONAZOLE 200 MG PO TABS: 200.0000 mg | ORAL_TABLET | Freq: Every day | ORAL | 0 refills | Status: DC

## 2019-08-01 MED ORDER — HYDROXYZINE HCL 25 MG PO TABS: 25.0000 mg | ORAL_TABLET | Freq: Every evening | ORAL | 0 refills | Status: DC | PRN
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
## 2019-08-01 NOTE — ED Triage Notes (Signed)
Pt presents to UC w/ c/o rash which started on his ankles and has spread over his legs, to his arms, and to his torso. This has been over 2-3 months. Pt has tried different home remedies and benadryl w/ little relief. Pt states it is affecting his sleep.

## 2019-08-27 DIAGNOSIS — L308 Other specified dermatitis: Secondary | ICD-10-CM | POA: Diagnosis not present

## 2019-08-27 DIAGNOSIS — L309 Dermatitis, unspecified: Secondary | ICD-10-CM | POA: Diagnosis not present

## 2019-08-28 DIAGNOSIS — H353111 Nonexudative age-related macular degeneration, right eye, early dry stage: Secondary | ICD-10-CM | POA: Diagnosis not present

## 2019-08-28 DIAGNOSIS — D3131 Benign neoplasm of right choroid: Secondary | ICD-10-CM | POA: Diagnosis not present

## 2019-08-28 DIAGNOSIS — H35371 Puckering of macula, right eye: Secondary | ICD-10-CM | POA: Diagnosis not present

## 2019-08-28 DIAGNOSIS — H353221 Exudative age-related macular degeneration, left eye, with active choroidal neovascularization: Secondary | ICD-10-CM | POA: Diagnosis not present

## 2019-10-09 DIAGNOSIS — H353221 Exudative age-related macular degeneration, left eye, with active choroidal neovascularization: Secondary | ICD-10-CM | POA: Diagnosis not present

## 2019-10-09 DIAGNOSIS — H3581 Retinal edema: Secondary | ICD-10-CM | POA: Diagnosis not present

## 2019-10-09 DIAGNOSIS — H353112 Nonexudative age-related macular degeneration, right eye, intermediate dry stage: Secondary | ICD-10-CM | POA: Diagnosis not present

## 2019-10-09 DIAGNOSIS — H35371 Puckering of macula, right eye: Secondary | ICD-10-CM | POA: Diagnosis not present

## 2019-11-21 DIAGNOSIS — H353112 Nonexudative age-related macular degeneration, right eye, intermediate dry stage: Secondary | ICD-10-CM | POA: Diagnosis not present

## 2019-11-21 DIAGNOSIS — H3540 Unspecified peripheral retinal degeneration: Secondary | ICD-10-CM | POA: Diagnosis not present

## 2019-11-21 DIAGNOSIS — H353221 Exudative age-related macular degeneration, left eye, with active choroidal neovascularization: Secondary | ICD-10-CM | POA: Diagnosis not present

## 2019-11-21 DIAGNOSIS — H35371 Puckering of macula, right eye: Secondary | ICD-10-CM | POA: Diagnosis not present

## 2020-01-02 DIAGNOSIS — H35371 Puckering of macula, right eye: Secondary | ICD-10-CM | POA: Diagnosis not present

## 2020-01-02 DIAGNOSIS — H353221 Exudative age-related macular degeneration, left eye, with active choroidal neovascularization: Secondary | ICD-10-CM | POA: Diagnosis not present

## 2020-01-02 DIAGNOSIS — H353112 Nonexudative age-related macular degeneration, right eye, intermediate dry stage: Secondary | ICD-10-CM | POA: Diagnosis not present

## 2020-01-02 DIAGNOSIS — D3131 Benign neoplasm of right choroid: Secondary | ICD-10-CM | POA: Diagnosis not present

## 2020-02-13 DIAGNOSIS — H35371 Puckering of macula, right eye: Secondary | ICD-10-CM | POA: Diagnosis not present

## 2020-02-13 DIAGNOSIS — H353112 Nonexudative age-related macular degeneration, right eye, intermediate dry stage: Secondary | ICD-10-CM | POA: Diagnosis not present

## 2020-02-13 DIAGNOSIS — H353221 Exudative age-related macular degeneration, left eye, with active choroidal neovascularization: Secondary | ICD-10-CM | POA: Diagnosis not present

## 2020-02-13 DIAGNOSIS — H3540 Unspecified peripheral retinal degeneration: Secondary | ICD-10-CM | POA: Diagnosis not present

## 2020-03-26 DIAGNOSIS — H353221 Exudative age-related macular degeneration, left eye, with active choroidal neovascularization: Secondary | ICD-10-CM | POA: Diagnosis not present

## 2020-03-26 DIAGNOSIS — H35371 Puckering of macula, right eye: Secondary | ICD-10-CM | POA: Diagnosis not present

## 2020-03-26 DIAGNOSIS — H3540 Unspecified peripheral retinal degeneration: Secondary | ICD-10-CM | POA: Diagnosis not present

## 2020-03-26 DIAGNOSIS — H353112 Nonexudative age-related macular degeneration, right eye, intermediate dry stage: Secondary | ICD-10-CM | POA: Diagnosis not present

## 2020-05-06 DIAGNOSIS — H353112 Nonexudative age-related macular degeneration, right eye, intermediate dry stage: Secondary | ICD-10-CM | POA: Diagnosis not present

## 2020-05-06 DIAGNOSIS — H353221 Exudative age-related macular degeneration, left eye, with active choroidal neovascularization: Secondary | ICD-10-CM | POA: Diagnosis not present

## 2020-05-06 DIAGNOSIS — H43813 Vitreous degeneration, bilateral: Secondary | ICD-10-CM | POA: Diagnosis not present

## 2020-05-06 DIAGNOSIS — H35371 Puckering of macula, right eye: Secondary | ICD-10-CM | POA: Diagnosis not present

## 2020-05-09 DIAGNOSIS — M2550 Pain in unspecified joint: Secondary | ICD-10-CM | POA: Diagnosis not present

## 2020-05-09 DIAGNOSIS — Z712 Person consulting for explanation of examination or test findings: Secondary | ICD-10-CM | POA: Diagnosis not present

## 2020-05-09 DIAGNOSIS — G4700 Insomnia, unspecified: Secondary | ICD-10-CM | POA: Diagnosis not present

## 2020-05-09 DIAGNOSIS — I10 Essential (primary) hypertension: Secondary | ICD-10-CM | POA: Diagnosis not present

## 2020-05-09 DIAGNOSIS — Z0000 Encounter for general adult medical examination without abnormal findings: Secondary | ICD-10-CM | POA: Diagnosis not present

## 2020-05-09 DIAGNOSIS — R945 Abnormal results of liver function studies: Secondary | ICD-10-CM | POA: Diagnosis not present

## 2020-05-09 DIAGNOSIS — R7301 Impaired fasting glucose: Secondary | ICD-10-CM | POA: Diagnosis not present

## 2020-05-13 DIAGNOSIS — Z0001 Encounter for general adult medical examination with abnormal findings: Secondary | ICD-10-CM | POA: Diagnosis not present

## 2020-06-17 DIAGNOSIS — H43813 Vitreous degeneration, bilateral: Secondary | ICD-10-CM | POA: Diagnosis not present

## 2020-06-17 DIAGNOSIS — H35371 Puckering of macula, right eye: Secondary | ICD-10-CM | POA: Diagnosis not present

## 2020-06-17 DIAGNOSIS — H353221 Exudative age-related macular degeneration, left eye, with active choroidal neovascularization: Secondary | ICD-10-CM | POA: Diagnosis not present

## 2020-06-17 DIAGNOSIS — H353112 Nonexudative age-related macular degeneration, right eye, intermediate dry stage: Secondary | ICD-10-CM | POA: Diagnosis not present

## 2020-07-29 DIAGNOSIS — H353221 Exudative age-related macular degeneration, left eye, with active choroidal neovascularization: Secondary | ICD-10-CM | POA: Diagnosis not present

## 2020-07-29 DIAGNOSIS — H35372 Puckering of macula, left eye: Secondary | ICD-10-CM | POA: Diagnosis not present

## 2020-07-29 DIAGNOSIS — H353112 Nonexudative age-related macular degeneration, right eye, intermediate dry stage: Secondary | ICD-10-CM | POA: Diagnosis not present

## 2020-09-09 DIAGNOSIS — H35371 Puckering of macula, right eye: Secondary | ICD-10-CM | POA: Diagnosis not present

## 2020-09-09 DIAGNOSIS — H353112 Nonexudative age-related macular degeneration, right eye, intermediate dry stage: Secondary | ICD-10-CM | POA: Diagnosis not present

## 2020-09-09 DIAGNOSIS — H3581 Retinal edema: Secondary | ICD-10-CM | POA: Diagnosis not present

## 2020-09-09 DIAGNOSIS — H353221 Exudative age-related macular degeneration, left eye, with active choroidal neovascularization: Secondary | ICD-10-CM | POA: Diagnosis not present

## 2020-10-22 DIAGNOSIS — H353221 Exudative age-related macular degeneration, left eye, with active choroidal neovascularization: Secondary | ICD-10-CM | POA: Diagnosis not present

## 2020-10-22 DIAGNOSIS — H353112 Nonexudative age-related macular degeneration, right eye, intermediate dry stage: Secondary | ICD-10-CM | POA: Diagnosis not present

## 2020-10-22 DIAGNOSIS — H35371 Puckering of macula, right eye: Secondary | ICD-10-CM | POA: Diagnosis not present

## 2020-10-22 DIAGNOSIS — H3581 Retinal edema: Secondary | ICD-10-CM | POA: Diagnosis not present

## 2020-10-31 DIAGNOSIS — E782 Mixed hyperlipidemia: Secondary | ICD-10-CM | POA: Diagnosis not present

## 2020-10-31 DIAGNOSIS — E875 Hyperkalemia: Secondary | ICD-10-CM | POA: Diagnosis not present

## 2020-10-31 DIAGNOSIS — I10 Essential (primary) hypertension: Secondary | ICD-10-CM | POA: Diagnosis not present

## 2020-10-31 DIAGNOSIS — R945 Abnormal results of liver function studies: Secondary | ICD-10-CM | POA: Diagnosis not present

## 2020-10-31 DIAGNOSIS — R7301 Impaired fasting glucose: Secondary | ICD-10-CM | POA: Diagnosis not present

## 2020-11-04 DIAGNOSIS — E782 Mixed hyperlipidemia: Secondary | ICD-10-CM | POA: Diagnosis not present

## 2020-11-04 DIAGNOSIS — R21 Rash and other nonspecific skin eruption: Secondary | ICD-10-CM | POA: Diagnosis not present

## 2020-11-04 DIAGNOSIS — R945 Abnormal results of liver function studies: Secondary | ICD-10-CM | POA: Diagnosis not present

## 2020-11-04 DIAGNOSIS — E875 Hyperkalemia: Secondary | ICD-10-CM | POA: Diagnosis not present

## 2020-11-04 DIAGNOSIS — R7301 Impaired fasting glucose: Secondary | ICD-10-CM | POA: Diagnosis not present

## 2020-11-04 DIAGNOSIS — I10 Essential (primary) hypertension: Secondary | ICD-10-CM | POA: Diagnosis not present

## 2020-11-04 DIAGNOSIS — F17203 Nicotine dependence unspecified, with withdrawal: Secondary | ICD-10-CM | POA: Diagnosis not present

## 2020-11-04 DIAGNOSIS — H3530 Unspecified macular degeneration: Secondary | ICD-10-CM | POA: Diagnosis not present

## 2020-11-04 DIAGNOSIS — Z2821 Immunization not carried out because of patient refusal: Secondary | ICD-10-CM | POA: Diagnosis not present

## 2020-11-04 DIAGNOSIS — M5442 Lumbago with sciatica, left side: Secondary | ICD-10-CM | POA: Diagnosis not present

## 2020-12-16 ENCOUNTER — Other Ambulatory Visit: Payer: Self-pay

## 2020-12-16 ENCOUNTER — Ambulatory Visit
Admission: EM | Admit: 2020-12-16 | Discharge: 2020-12-16 | Disposition: A | Discharge status: Home / Self Care | Payer: Medicare Other | Attending: Family Medicine | Admitting: Family Medicine

## 2020-12-16 DIAGNOSIS — M5432 Sciatica, left side: Secondary | ICD-10-CM

## 2020-12-16 DIAGNOSIS — M5431 Sciatica, right side: Secondary | ICD-10-CM

## 2020-12-16 MED ORDER — TIZANIDINE HCL 4 MG PO TABS: 4.0000 mg | ORAL_TABLET | Freq: Four times a day (QID) | ORAL | 0 refills | Status: DC | PRN

## 2020-12-16 MED ORDER — METHYLPREDNISOLONE 4 MG PO TBPK: ORAL_TABLET | Freq: Four times a day (QID) | ORAL | 0 refills | Status: DC

## 2020-12-16 MED ORDER — KETOROLAC TROMETHAMINE 30 MG/ML IJ SOLN
30.0000 mg | Freq: Once | INTRAMUSCULAR | Status: AC
  Administered 2020-12-16: 30 mg via INTRAMUSCULAR

## 2020-12-16 MED ORDER — DEXAMETHASONE SODIUM PHOSPHATE 10 MG/ML IJ SOLN
10.0000 mg | Freq: Once | INTRAMUSCULAR | Status: AC
  Administered 2020-12-16: 10 mg via INTRAMUSCULAR
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
## 2020-12-16 NOTE — Discharge Instructions (Addendum)
You have received a steroid injection as well as toradol for pain  I have sent in methylprednisolone for you to take 4 doses on day 1, 3 doses on day 2, 2 doses on day 3 and 1 dose on day 4.  I have also sent in tizanidine for you to take every 6 hours as needed for muscle spasms and pain.  This medication can make you sleepy.  Do not drive or operate heavy machinery with this medication until you know how it affects you.  Follow up with this office or with primary care if symptoms are persisting.  Follow up in the ER for high fever, trouble swallowing, trouble breathing, other concerning symptoms.

## 2020-12-16 NOTE — ED Triage Notes (Signed)
Pt presents with left lower back pain and is wanting cortisone injection , provider made aware

## 2020-12-30 DIAGNOSIS — H353112 Nonexudative age-related macular degeneration, right eye, intermediate dry stage: Secondary | ICD-10-CM | POA: Diagnosis not present

## 2020-12-30 DIAGNOSIS — H353221 Exudative age-related macular degeneration, left eye, with active choroidal neovascularization: Secondary | ICD-10-CM | POA: Diagnosis not present

## 2020-12-30 DIAGNOSIS — H3581 Retinal edema: Secondary | ICD-10-CM | POA: Diagnosis not present

## 2020-12-30 DIAGNOSIS — H35371 Puckering of macula, right eye: Secondary | ICD-10-CM | POA: Diagnosis not present

## 2021-01-26 DIAGNOSIS — M9905 Segmental and somatic dysfunction of pelvic region: Secondary | ICD-10-CM | POA: Diagnosis not present

## 2021-01-26 DIAGNOSIS — M9903 Segmental and somatic dysfunction of lumbar region: Secondary | ICD-10-CM | POA: Diagnosis not present

## 2021-01-26 DIAGNOSIS — M9902 Segmental and somatic dysfunction of thoracic region: Secondary | ICD-10-CM | POA: Diagnosis not present

## 2021-01-26 DIAGNOSIS — M5442 Lumbago with sciatica, left side: Secondary | ICD-10-CM | POA: Diagnosis not present

## 2021-01-28 DIAGNOSIS — M5442 Lumbago with sciatica, left side: Secondary | ICD-10-CM | POA: Diagnosis not present

## 2021-01-28 DIAGNOSIS — M9903 Segmental and somatic dysfunction of lumbar region: Secondary | ICD-10-CM | POA: Diagnosis not present

## 2021-01-28 DIAGNOSIS — M9905 Segmental and somatic dysfunction of pelvic region: Secondary | ICD-10-CM | POA: Diagnosis not present

## 2021-01-28 DIAGNOSIS — M9902 Segmental and somatic dysfunction of thoracic region: Secondary | ICD-10-CM | POA: Diagnosis not present

## 2021-01-30 DIAGNOSIS — M9902 Segmental and somatic dysfunction of thoracic region: Secondary | ICD-10-CM | POA: Diagnosis not present

## 2021-01-30 DIAGNOSIS — M5442 Lumbago with sciatica, left side: Secondary | ICD-10-CM | POA: Diagnosis not present

## 2021-01-30 DIAGNOSIS — M9903 Segmental and somatic dysfunction of lumbar region: Secondary | ICD-10-CM | POA: Diagnosis not present

## 2021-01-30 DIAGNOSIS — M9905 Segmental and somatic dysfunction of pelvic region: Secondary | ICD-10-CM | POA: Diagnosis not present

## 2021-02-04 DIAGNOSIS — M9905 Segmental and somatic dysfunction of pelvic region: Secondary | ICD-10-CM | POA: Diagnosis not present

## 2021-02-04 DIAGNOSIS — M9903 Segmental and somatic dysfunction of lumbar region: Secondary | ICD-10-CM | POA: Diagnosis not present

## 2021-02-04 DIAGNOSIS — M9902 Segmental and somatic dysfunction of thoracic region: Secondary | ICD-10-CM | POA: Diagnosis not present

## 2021-02-04 DIAGNOSIS — M5442 Lumbago with sciatica, left side: Secondary | ICD-10-CM | POA: Diagnosis not present

## 2021-02-06 DIAGNOSIS — M9903 Segmental and somatic dysfunction of lumbar region: Secondary | ICD-10-CM | POA: Diagnosis not present

## 2021-02-06 DIAGNOSIS — M9905 Segmental and somatic dysfunction of pelvic region: Secondary | ICD-10-CM | POA: Diagnosis not present

## 2021-02-06 DIAGNOSIS — M5442 Lumbago with sciatica, left side: Secondary | ICD-10-CM | POA: Diagnosis not present

## 2021-02-06 DIAGNOSIS — M9902 Segmental and somatic dysfunction of thoracic region: Secondary | ICD-10-CM | POA: Diagnosis not present

## 2021-02-11 DIAGNOSIS — M9903 Segmental and somatic dysfunction of lumbar region: Secondary | ICD-10-CM | POA: Diagnosis not present

## 2021-02-11 DIAGNOSIS — M5442 Lumbago with sciatica, left side: Secondary | ICD-10-CM | POA: Diagnosis not present

## 2021-02-11 DIAGNOSIS — M9905 Segmental and somatic dysfunction of pelvic region: Secondary | ICD-10-CM | POA: Diagnosis not present

## 2021-02-11 DIAGNOSIS — M9902 Segmental and somatic dysfunction of thoracic region: Secondary | ICD-10-CM | POA: Diagnosis not present

## 2021-02-13 DIAGNOSIS — M5442 Lumbago with sciatica, left side: Secondary | ICD-10-CM | POA: Diagnosis not present

## 2021-02-13 DIAGNOSIS — M9902 Segmental and somatic dysfunction of thoracic region: Secondary | ICD-10-CM | POA: Diagnosis not present

## 2021-02-13 DIAGNOSIS — M9905 Segmental and somatic dysfunction of pelvic region: Secondary | ICD-10-CM | POA: Diagnosis not present

## 2021-02-13 DIAGNOSIS — M9903 Segmental and somatic dysfunction of lumbar region: Secondary | ICD-10-CM | POA: Diagnosis not present

## 2021-03-24 DIAGNOSIS — H353112 Nonexudative age-related macular degeneration, right eye, intermediate dry stage: Secondary | ICD-10-CM | POA: Diagnosis not present

## 2021-03-24 DIAGNOSIS — H3581 Retinal edema: Secondary | ICD-10-CM | POA: Diagnosis not present

## 2021-03-24 DIAGNOSIS — H35371 Puckering of macula, right eye: Secondary | ICD-10-CM | POA: Diagnosis not present

## 2021-03-24 DIAGNOSIS — H353221 Exudative age-related macular degeneration, left eye, with active choroidal neovascularization: Secondary | ICD-10-CM | POA: Diagnosis not present

## 2021-04-27 DIAGNOSIS — I10 Essential (primary) hypertension: Secondary | ICD-10-CM

## 2021-04-27 DIAGNOSIS — F411 Generalized anxiety disorder: Secondary | ICD-10-CM

## 2021-04-27 HISTORY — DX: Essential (primary) hypertension: I10

## 2021-04-27 HISTORY — DX: Generalized anxiety disorder: F41.1

## 2021-05-06 DIAGNOSIS — R7301 Impaired fasting glucose: Secondary | ICD-10-CM | POA: Diagnosis not present

## 2021-05-06 DIAGNOSIS — E538 Deficiency of other specified B group vitamins: Secondary | ICD-10-CM | POA: Diagnosis not present

## 2021-05-06 DIAGNOSIS — I10 Essential (primary) hypertension: Secondary | ICD-10-CM | POA: Diagnosis not present

## 2021-05-12 DIAGNOSIS — H3530 Unspecified macular degeneration: Secondary | ICD-10-CM

## 2021-05-12 DIAGNOSIS — Z0001 Encounter for general adult medical examination with abnormal findings: Secondary | ICD-10-CM | POA: Diagnosis not present

## 2021-05-12 DIAGNOSIS — E875 Hyperkalemia: Secondary | ICD-10-CM | POA: Diagnosis not present

## 2021-05-12 DIAGNOSIS — R7989 Other specified abnormal findings of blood chemistry: Secondary | ICD-10-CM | POA: Insufficient documentation

## 2021-05-12 DIAGNOSIS — E782 Mixed hyperlipidemia: Secondary | ICD-10-CM | POA: Diagnosis not present

## 2021-05-12 DIAGNOSIS — H6122 Impacted cerumen, left ear: Secondary | ICD-10-CM | POA: Diagnosis not present

## 2021-05-12 DIAGNOSIS — I10 Essential (primary) hypertension: Secondary | ICD-10-CM | POA: Diagnosis not present

## 2021-05-12 DIAGNOSIS — M5440 Lumbago with sciatica, unspecified side: Secondary | ICD-10-CM | POA: Insufficient documentation

## 2021-05-12 DIAGNOSIS — R7301 Impaired fasting glucose: Secondary | ICD-10-CM | POA: Diagnosis not present

## 2021-05-12 DIAGNOSIS — W19XXXA Unspecified fall, initial encounter: Secondary | ICD-10-CM

## 2021-05-12 DIAGNOSIS — R945 Abnormal results of liver function studies: Secondary | ICD-10-CM | POA: Diagnosis not present

## 2021-05-12 DIAGNOSIS — L309 Dermatitis, unspecified: Secondary | ICD-10-CM | POA: Diagnosis not present

## 2021-05-12 DIAGNOSIS — F17200 Nicotine dependence, unspecified, uncomplicated: Secondary | ICD-10-CM | POA: Diagnosis not present

## 2021-05-12 DIAGNOSIS — F1010 Alcohol abuse, uncomplicated: Secondary | ICD-10-CM | POA: Diagnosis present

## 2021-05-12 DIAGNOSIS — R5383 Other fatigue: Secondary | ICD-10-CM

## 2021-05-12 HISTORY — DX: Alcohol abuse, uncomplicated: F10.10

## 2021-05-12 HISTORY — DX: Other fatigue: R53.83

## 2021-05-12 HISTORY — DX: Unspecified fall, initial encounter: W19.XXXA

## 2021-05-12 HISTORY — DX: Unspecified macular degeneration: H35.30

## 2021-05-28 DIAGNOSIS — I872 Venous insufficiency (chronic) (peripheral): Secondary | ICD-10-CM | POA: Diagnosis not present

## 2021-05-28 DIAGNOSIS — L858 Other specified epidermal thickening: Secondary | ICD-10-CM | POA: Diagnosis not present

## 2021-06-05 ENCOUNTER — Ambulatory Visit
Admission: EM | Admit: 2021-06-05 | Discharge: 2021-06-05 | Disposition: A | Discharge status: Home / Self Care | Payer: Medicare Other | Attending: Urgent Care | Admitting: Urgent Care

## 2021-06-05 ENCOUNTER — Other Ambulatory Visit: Payer: Self-pay

## 2021-06-05 ENCOUNTER — Encounter: Payer: Self-pay | Admitting: Emergency Medicine

## 2021-06-05 DIAGNOSIS — L309 Dermatitis, unspecified: Secondary | ICD-10-CM | POA: Diagnosis not present

## 2021-06-05 DIAGNOSIS — L299 Pruritus, unspecified: Secondary | ICD-10-CM | POA: Diagnosis not present

## 2021-06-05 MED ORDER — PREDNISONE 20 MG PO TABS: ORAL_TABLET | ORAL | 0 refills | Status: DC

## 2021-06-05 NOTE — ED Triage Notes (Signed)
Patient c/o itching due to eczema x over 2 years.   Patient endorses eczema on several areas on the body that cause itching.   Patient is interested in receiving oral steroids for this issue.   Patient has used a steroid cream with no relief of symptoms.
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
## 2021-06-09 DIAGNOSIS — H353221 Exudative age-related macular degeneration, left eye, with active choroidal neovascularization: Secondary | ICD-10-CM | POA: Diagnosis not present

## 2021-06-09 DIAGNOSIS — H43811 Vitreous degeneration, right eye: Secondary | ICD-10-CM | POA: Diagnosis not present

## 2021-06-09 DIAGNOSIS — H353112 Nonexudative age-related macular degeneration, right eye, intermediate dry stage: Secondary | ICD-10-CM | POA: Diagnosis not present

## 2021-06-09 DIAGNOSIS — H35371 Puckering of macula, right eye: Secondary | ICD-10-CM | POA: Diagnosis not present

## 2021-06-25 DIAGNOSIS — L858 Other specified epidermal thickening: Secondary | ICD-10-CM | POA: Diagnosis not present

## 2021-06-25 DIAGNOSIS — I872 Venous insufficiency (chronic) (peripheral): Secondary | ICD-10-CM | POA: Diagnosis not present

## 2021-09-01 DIAGNOSIS — H3581 Retinal edema: Secondary | ICD-10-CM | POA: Diagnosis not present

## 2021-09-01 DIAGNOSIS — H353221 Exudative age-related macular degeneration, left eye, with active choroidal neovascularization: Secondary | ICD-10-CM | POA: Diagnosis not present

## 2021-09-01 DIAGNOSIS — H35371 Puckering of macula, right eye: Secondary | ICD-10-CM | POA: Diagnosis not present

## 2021-09-01 DIAGNOSIS — H353112 Nonexudative age-related macular degeneration, right eye, intermediate dry stage: Secondary | ICD-10-CM | POA: Diagnosis not present

## 2021-11-24 DIAGNOSIS — H353112 Nonexudative age-related macular degeneration, right eye, intermediate dry stage: Secondary | ICD-10-CM | POA: Diagnosis not present

## 2021-11-24 DIAGNOSIS — H353221 Exudative age-related macular degeneration, left eye, with active choroidal neovascularization: Secondary | ICD-10-CM | POA: Diagnosis not present

## 2021-11-24 DIAGNOSIS — H35371 Puckering of macula, right eye: Secondary | ICD-10-CM | POA: Diagnosis not present

## 2022-01-25 DIAGNOSIS — L309 Dermatitis, unspecified: Secondary | ICD-10-CM | POA: Diagnosis not present

## 2022-01-28 ENCOUNTER — Emergency Department (HOSPITAL_COMMUNITY): Payer: Medicare Other

## 2022-01-28 ENCOUNTER — Inpatient Hospital Stay (HOSPITAL_COMMUNITY)
Admission: EM | Admit: 2022-01-28 | Discharge: 2022-02-01 | DRG: 092 | Disposition: A | Discharge status: Home Health | Payer: Medicare Other | Attending: Internal Medicine | Admitting: Internal Medicine

## 2022-01-28 ENCOUNTER — Inpatient Hospital Stay (HOSPITAL_COMMUNITY): Payer: Medicare Other

## 2022-01-28 ENCOUNTER — Encounter (HOSPITAL_COMMUNITY): Payer: Self-pay | Admitting: Emergency Medicine

## 2022-01-28 DIAGNOSIS — Z87891 Personal history of nicotine dependence: Secondary | ICD-10-CM | POA: Diagnosis not present

## 2022-01-28 DIAGNOSIS — Z452 Encounter for adjustment and management of vascular access device: Secondary | ICD-10-CM | POA: Diagnosis not present

## 2022-01-28 DIAGNOSIS — R579 Shock, unspecified: Secondary | ICD-10-CM | POA: Diagnosis not present

## 2022-01-28 DIAGNOSIS — F10931 Alcohol use, unspecified with withdrawal delirium: Secondary | ICD-10-CM | POA: Diagnosis not present

## 2022-01-28 DIAGNOSIS — Z7982 Long term (current) use of aspirin: Secondary | ICD-10-CM

## 2022-01-28 DIAGNOSIS — Z7952 Long term (current) use of systemic steroids: Secondary | ICD-10-CM | POA: Diagnosis not present

## 2022-01-28 DIAGNOSIS — S22080A Wedge compression fracture of T11-T12 vertebra, initial encounter for closed fracture: Secondary | ICD-10-CM

## 2022-01-28 DIAGNOSIS — J449 Chronic obstructive pulmonary disease, unspecified: Secondary | ICD-10-CM | POA: Diagnosis present

## 2022-01-28 DIAGNOSIS — L309 Dermatitis, unspecified: Secondary | ICD-10-CM | POA: Diagnosis not present

## 2022-01-28 DIAGNOSIS — Z79899 Other long term (current) drug therapy: Secondary | ICD-10-CM | POA: Diagnosis not present

## 2022-01-28 DIAGNOSIS — F419 Anxiety disorder, unspecified: Secondary | ICD-10-CM | POA: Diagnosis present

## 2022-01-28 DIAGNOSIS — Z8249 Family history of ischemic heart disease and other diseases of the circulatory system: Secondary | ICD-10-CM

## 2022-01-28 DIAGNOSIS — E872 Acidosis, unspecified: Secondary | ICD-10-CM | POA: Diagnosis not present

## 2022-01-28 DIAGNOSIS — N390 Urinary tract infection, site not specified: Secondary | ICD-10-CM | POA: Diagnosis present

## 2022-01-28 DIAGNOSIS — E874 Mixed disorder of acid-base balance: Secondary | ICD-10-CM | POA: Diagnosis not present

## 2022-01-28 DIAGNOSIS — Z96652 Presence of left artificial knee joint: Secondary | ICD-10-CM | POA: Diagnosis present

## 2022-01-28 DIAGNOSIS — R6889 Other general symptoms and signs: Secondary | ICD-10-CM | POA: Diagnosis not present

## 2022-01-28 DIAGNOSIS — R4182 Altered mental status, unspecified: Principal | ICD-10-CM

## 2022-01-28 DIAGNOSIS — K5730 Diverticulosis of large intestine without perforation or abscess without bleeding: Secondary | ICD-10-CM | POA: Diagnosis not present

## 2022-01-28 DIAGNOSIS — Z9081 Acquired absence of spleen: Secondary | ICD-10-CM | POA: Diagnosis not present

## 2022-01-28 DIAGNOSIS — E7800 Pure hypercholesterolemia, unspecified: Secondary | ICD-10-CM | POA: Diagnosis not present

## 2022-01-28 DIAGNOSIS — E785 Hyperlipidemia, unspecified: Secondary | ICD-10-CM | POA: Diagnosis not present

## 2022-01-28 DIAGNOSIS — E871 Hypo-osmolality and hyponatremia: Secondary | ICD-10-CM | POA: Diagnosis not present

## 2022-01-28 DIAGNOSIS — J984 Other disorders of lung: Secondary | ICD-10-CM | POA: Diagnosis not present

## 2022-01-28 DIAGNOSIS — A419 Sepsis, unspecified organism: Secondary | ICD-10-CM | POA: Diagnosis not present

## 2022-01-28 DIAGNOSIS — T39094A Poisoning by salicylates, undetermined, initial encounter: Secondary | ICD-10-CM | POA: Diagnosis not present

## 2022-01-28 DIAGNOSIS — J9811 Atelectasis: Secondary | ICD-10-CM | POA: Diagnosis not present

## 2022-01-28 DIAGNOSIS — Z20822 Contact with and (suspected) exposure to covid-19: Secondary | ICD-10-CM | POA: Diagnosis not present

## 2022-01-28 DIAGNOSIS — R0602 Shortness of breath: Secondary | ICD-10-CM | POA: Diagnosis not present

## 2022-01-28 DIAGNOSIS — T39094D Poisoning by salicylates, undetermined, subsequent encounter: Secondary | ICD-10-CM | POA: Diagnosis not present

## 2022-01-28 DIAGNOSIS — E876 Hypokalemia: Secondary | ICD-10-CM | POA: Diagnosis not present

## 2022-01-28 DIAGNOSIS — R064 Hyperventilation: Secondary | ICD-10-CM

## 2022-01-28 DIAGNOSIS — S22080D Wedge compression fracture of T11-T12 vertebra, subsequent encounter for fracture with routine healing: Secondary | ICD-10-CM | POA: Diagnosis not present

## 2022-01-28 DIAGNOSIS — T39095A Adverse effect of salicylates, initial encounter: Secondary | ICD-10-CM | POA: Diagnosis present

## 2022-01-28 DIAGNOSIS — F10239 Alcohol dependence with withdrawal, unspecified: Secondary | ICD-10-CM | POA: Diagnosis present

## 2022-01-28 DIAGNOSIS — T39091A Poisoning by salicylates, accidental (unintentional), initial encounter: Secondary | ICD-10-CM | POA: Diagnosis not present

## 2022-01-28 DIAGNOSIS — I2510 Atherosclerotic heart disease of native coronary artery without angina pectoris: Secondary | ICD-10-CM | POA: Diagnosis not present

## 2022-01-28 DIAGNOSIS — G928 Other toxic encephalopathy: Principal | ICD-10-CM | POA: Diagnosis present

## 2022-01-28 DIAGNOSIS — Z743 Need for continuous supervision: Secondary | ICD-10-CM | POA: Diagnosis not present

## 2022-01-28 DIAGNOSIS — F10939 Alcohol use, unspecified with withdrawal, unspecified: Secondary | ICD-10-CM | POA: Diagnosis present

## 2022-01-28 DIAGNOSIS — M47816 Spondylosis without myelopathy or radiculopathy, lumbar region: Secondary | ICD-10-CM | POA: Diagnosis not present

## 2022-01-28 DIAGNOSIS — D72829 Elevated white blood cell count, unspecified: Secondary | ICD-10-CM | POA: Diagnosis present

## 2022-01-28 LAB — BASIC METABOLIC PANEL
Anion gap: 16 — ABNORMAL HIGH (ref 5–15)
Anion gap: 19 — ABNORMAL HIGH (ref 5–15)
Anion gap: 10 (ref 5–15)
BUN: 17 mg/dL (ref 8–23)
BUN: 19 mg/dL (ref 8–23)
BUN: 13 mg/dL (ref 8–23)
CO2: 18 mmol/L — ABNORMAL LOW (ref 22–32)
CO2: 16 mmol/L — ABNORMAL LOW (ref 22–32)
CO2: 29 mmol/L (ref 22–32)
Calcium: 8.2 mg/dL — ABNORMAL LOW (ref 8.9–10.3)
Calcium: 7.3 mg/dL — ABNORMAL LOW (ref 8.9–10.3)
Calcium: 7.2 mg/dL — ABNORMAL LOW (ref 8.9–10.3)
Chloride: 101 mmol/L (ref 98–111)
Chloride: 102 mmol/L (ref 98–111)
Chloride: 95 mmol/L — ABNORMAL LOW (ref 98–111)
Creatinine, Ser: 1.04 mg/dL (ref 0.61–1.24)
Creatinine, Ser: 1.11 mg/dL (ref 0.61–1.24)
Creatinine, Ser: 0.8 mg/dL (ref 0.61–1.24)
GFR, Estimated: >60 mL/min (ref >60)
GFR, Estimated: >60 mL/min (ref >60)
GFR, Estimated: >60 mL/min (ref >60)
Glucose, Bld: 116 mg/dL — ABNORMAL HIGH (ref 70–99)
Glucose, Bld: 136 mg/dL — ABNORMAL HIGH (ref 70–99)
Glucose, Bld: 214 mg/dL — ABNORMAL HIGH (ref 70–99)
Potassium: 3.6 mmol/L (ref 3.5–5.1)
Potassium: 3.9 mmol/L (ref 3.5–5.1)
Potassium: 2.7 mmol/L — CL (ref 3.5–5.1)
Sodium: 135 mmol/L (ref 135–145)
Sodium: 137 mmol/L (ref 135–145)
Sodium: 134 mmol/L — ABNORMAL LOW (ref 135–145)

## 2022-01-28 LAB — HEPATITIS B SURFACE ANTIGEN: Hepatitis B Surface Ag: NONREACTIVE

## 2022-01-28 LAB — SARS CORONAVIRUS 2 BY RT PCR: SARS Coronavirus 2 by RT PCR: NEGATIVE

## 2022-01-28 LAB — CBC WITH DIFFERENTIAL/PLATELET
Abs Immature Granulocytes: 0.17 10*3/uL — ABNORMAL HIGH (ref 0.00–0.07)
Basophils Absolute: 0.1 10*3/uL (ref 0.0–0.1)
Basophils Relative: 1 %
Eosinophils Absolute: 0 10*3/uL (ref 0.0–0.5)
Eosinophils Relative: 0 %
HCT: 38 % — ABNORMAL LOW (ref 39.0–52.0)
Hemoglobin: 13.8 g/dL (ref 13.0–17.0)
Immature Granulocytes: 1 %
Lymphocytes Relative: 5 %
Lymphs Abs: 0.8 10*3/uL (ref 0.7–4.0)
MCH: 36.2 pg — ABNORMAL HIGH (ref 26.0–34.0)
MCHC: 36.3 g/dL — ABNORMAL HIGH (ref 30.0–36.0)
MCV: 99.7 fL (ref 80.0–100.0)
Monocytes Absolute: 1.6 10*3/uL — ABNORMAL HIGH (ref 0.1–1.0)
Monocytes Relative: 10 %
Neutro Abs: 13.9 10*3/uL — ABNORMAL HIGH (ref 1.7–7.7)
Neutrophils Relative %: 83 %
Platelets: 276 10*3/uL (ref 150–400)
RBC: 3.81 MIL/uL — ABNORMAL LOW (ref 4.22–5.81)
RDW: 14.2 % (ref 11.5–15.5)
WBC: 16.6 10*3/uL — ABNORMAL HIGH (ref 4.0–10.5)
nRBC: 0 % (ref 0.0–0.2)

## 2022-01-28 LAB — BLOOD GAS, ARTERIAL
Acid-base deficit: 4.5 mmol/L — ABNORMAL HIGH (ref 0.0–2.0)
Bicarbonate: 17.1 mmol/L — ABNORMAL LOW (ref 20.0–28.0)
Drawn by: 27407
FIO2: 21 %
O2 Saturation: 99.8 %
Patient temperature: 36.8
pCO2 arterial: 23 mmHg — ABNORMAL LOW (ref 32–48)
pH, Arterial: 7.48 — ABNORMAL HIGH (ref 7.35–7.45)
pO2, Arterial: 99 mmHg (ref 83–108)

## 2022-01-28 LAB — HEPATITIS C ANTIBODY: HCV Ab: NONREACTIVE

## 2022-01-28 LAB — BRAIN NATRIURETIC PEPTIDE: B Natriuretic Peptide: 90 pg/mL (ref 0.0–100.0)

## 2022-01-28 LAB — SALICYLATE LEVEL
Salicylate Lvl: 101.4 mg/dL (ref 7.0–30.0)
Salicylate Lvl: 105.1 mg/dL (ref 7.0–30.0)

## 2022-01-28 LAB — LACTIC ACID, PLASMA: Lactic Acid, Venous: 0.9 mmol/L (ref 0.5–1.9)

## 2022-01-28 LAB — AMMONIA: Ammonia: 13 umol/L (ref 9–35)

## 2022-01-28 LAB — D-DIMER, QUANTITATIVE: D-Dimer, Quant: 0.46 ug/mL-FEU (ref 0.00–0.50)

## 2022-01-28 LAB — TROPONIN I (HIGH SENSITIVITY)
Troponin I (High Sensitivity): 6 ng/L (ref <18)
Troponin I (High Sensitivity): 7 ng/L (ref <18)

## 2022-01-28 LAB — HEPATITIS B SURFACE ANTIBODY,QUALITATIVE: Hep B S Ab: NONREACTIVE

## 2022-01-28 LAB — ACETAMINOPHEN LEVEL: Acetaminophen (Tylenol), Serum: <10 ug/mL — ABNORMAL LOW (ref 10–30)

## 2022-01-28 LAB — ETHANOL: Alcohol, Ethyl (B): <10 mg/dL (ref <10)

## 2022-01-28 LAB — HEPATITIS B CORE ANTIBODY, TOTAL: Hep B Core Total Ab: NONREACTIVE

## 2022-01-28 LAB — CBG MONITORING, ED: Glucose-Capillary: 99 mg/dL (ref 70–99)

## 2022-01-28 MED ORDER — CHLORHEXIDINE GLUCONATE CLOTH 2 % EX PADS
6.0000 | MEDICATED_PAD | Freq: Every day | CUTANEOUS | Status: DC
Start: 2022-01-28 — End: 2022-01-28
  Administered 2022-01-28: 6 via TOPICAL

## 2022-01-28 MED ORDER — THIAMINE HCL 100 MG/ML IJ SOLN
500.0000 mg | Freq: Once | INTRAMUSCULAR | Status: AC
  Administered 2022-01-28: 500 mg via INTRAVENOUS
  Filled 2022-01-28: qty 6

## 2022-01-28 MED ORDER — ACETAMINOPHEN 650 MG RE SUPP: 650.0000 mg | Freq: Four times a day (QID) | RECTAL | Status: DC | PRN

## 2022-01-28 MED ORDER — HEPARIN SODIUM (PORCINE) 1000 UNIT/ML IJ SOLN
4000.0000 [IU] | Freq: Once | INTRAMUSCULAR | Status: AC
  Administered 2022-01-28: 4000 [IU]
  Filled 2022-01-28: qty 4

## 2022-01-28 MED ORDER — ALBUTEROL SULFATE (2.5 MG/3ML) 0.083% IN NEBU
INHALATION_SOLUTION | RESPIRATORY_TRACT | Status: AC
  Filled 2022-01-28: qty 3

## 2022-01-28 MED ORDER — IPRATROPIUM-ALBUTEROL 0.5-2.5 (3) MG/3ML IN SOLN
3.0000 mL | RESPIRATORY_TRACT | Status: DC | PRN
  Administered 2022-01-31 (×2): 3 mL via RESPIRATORY_TRACT
  Filled 2022-01-28 (×2): qty 3

## 2022-01-28 MED ORDER — LORAZEPAM 1 MG PO TABS: 1.0000 mg | ORAL_TABLET | ORAL | Status: AC | PRN

## 2022-01-28 MED ORDER — ACETAMINOPHEN 325 MG PO TABS: 650.0000 mg | ORAL_TABLET | Freq: Four times a day (QID) | ORAL | Status: DC | PRN

## 2022-01-28 MED ORDER — HEPARIN SODIUM (PORCINE) 1000 UNIT/ML DIALYSIS: 1000.0000 [IU] | INTRAMUSCULAR | Status: DC | PRN

## 2022-01-28 MED ORDER — LIDOCAINE HCL (PF) 1 % IJ SOLN
INTRAMUSCULAR | Status: AC
  Administered 2022-01-28: 5 mL
  Filled 2022-01-28: qty 5

## 2022-01-28 MED ORDER — LORAZEPAM 2 MG/ML IJ SOLN
1.0000 mg | Freq: Once | INTRAMUSCULAR | Status: AC
  Administered 2022-01-28: 1 mg via INTRAVENOUS
  Filled 2022-01-28: qty 1

## 2022-01-28 MED ORDER — IPRATROPIUM-ALBUTEROL 0.5-2.5 (3) MG/3ML IN SOLN
3.0000 mL | Freq: Once | RESPIRATORY_TRACT | Status: AC
  Administered 2022-01-28: 3 mL via RESPIRATORY_TRACT

## 2022-01-28 MED ORDER — PENTAFLUOROPROP-TETRAFLUOROETH EX AERO: 1.0000 | INHALATION_SPRAY | CUTANEOUS | Status: DC | PRN

## 2022-01-28 MED ORDER — NYSTATIN 100000 UNIT/GM EX CREA
TOPICAL_CREAM | Freq: Two times a day (BID) | CUTANEOUS | Status: DC
  Administered 2022-01-30: 1 via TOPICAL
  Filled 2022-01-28 (×2): qty 15

## 2022-01-28 MED ORDER — ALTEPLASE 2 MG IJ SOLR: 2.0000 mg | Freq: Once | INTRAMUSCULAR | Status: DC | PRN

## 2022-01-28 MED ORDER — IOHEXOL 350 MG/ML SOLN
100.0000 mL | Freq: Once | INTRAVENOUS | Status: AC | PRN
  Administered 2022-01-28: 100 mL via INTRAVENOUS

## 2022-01-28 MED ORDER — PANTOPRAZOLE SODIUM 40 MG IV SOLR
40.0000 mg | INTRAVENOUS | Status: DC
  Administered 2022-01-28 – 2022-01-30 (×3): 40 mg via INTRAVENOUS
  Filled 2022-01-28 (×3): qty 10

## 2022-01-28 MED ORDER — CHLORHEXIDINE GLUCONATE CLOTH 2 % EX PADS
6.0000 | MEDICATED_PAD | Freq: Every day | CUTANEOUS | Status: DC
  Administered 2022-01-29 – 2022-02-01 (×4): 6 via TOPICAL

## 2022-01-28 MED ORDER — ANTICOAGULANT SODIUM CITRATE 4% (200MG/5ML) IV SOLN: 5.0000 mL | Status: DC | PRN

## 2022-01-28 MED ORDER — ALBUMIN HUMAN 25 % IV SOLN
INTRAVENOUS | Status: AC
  Administered 2022-01-28: 25 g
  Filled 2022-01-28: qty 100

## 2022-01-28 MED ORDER — THIAMINE HCL 100 MG/ML IJ SOLN
250.0000 mg | Freq: Two times a day (BID) | INTRAVENOUS | Status: DC
  Administered 2022-01-28 – 2022-01-31 (×6): 250 mg via INTRAVENOUS
  Filled 2022-01-28 (×8): qty 2.5

## 2022-01-28 MED ORDER — NOREPINEPHRINE 4 MG/250ML-% IV SOLN: 0.0000 ug/min | INTRAVENOUS | Status: DC

## 2022-01-28 MED ORDER — SODIUM CHLORIDE 0.9 % IV BOLUS
1000.0000 mL | Freq: Once | INTRAVENOUS | Status: AC
  Administered 2022-01-28: 1000 mL via INTRAVENOUS

## 2022-01-28 MED ORDER — LORAZEPAM 2 MG/ML IJ SOLN
1.0000 mg | INTRAMUSCULAR | Status: AC | PRN
  Administered 2022-01-28 – 2022-01-29 (×3): 2 mg via INTRAVENOUS
  Administered 2022-01-29 (×2): 4 mg via INTRAVENOUS
  Administered 2022-01-29 (×2): 2 mg via INTRAVENOUS
  Administered 2022-01-29: 4 mg via INTRAVENOUS
  Administered 2022-01-30: 3 mg via INTRAVENOUS
  Administered 2022-01-30 (×3): 2 mg via INTRAVENOUS
  Filled 2022-01-28 (×3): qty 1
  Filled 2022-01-28: qty 2
  Filled 2022-01-28: qty 1
  Filled 2022-01-28 (×2): qty 2
  Filled 2022-01-28: qty 1
  Filled 2022-01-28: qty 2
  Filled 2022-01-28 (×3): qty 1

## 2022-01-28 MED ORDER — ENOXAPARIN SODIUM 40 MG/0.4ML IJ SOSY
40.0000 mg | PREFILLED_SYRINGE | INTRAMUSCULAR | Status: DC
  Administered 2022-01-28 – 2022-01-31 (×4): 40 mg via SUBCUTANEOUS
  Filled 2022-01-28 (×4): qty 0.4

## 2022-01-28 MED ORDER — IPRATROPIUM-ALBUTEROL 0.5-2.5 (3) MG/3ML IN SOLN
RESPIRATORY_TRACT | Status: AC
  Filled 2022-01-28: qty 3

## 2022-01-28 MED ORDER — LIDOCAINE HCL (PF) 1 % IJ SOLN: 5.0000 mL | INTRAMUSCULAR | Status: DC | PRN

## 2022-01-28 MED ORDER — SODIUM BICARBONATE 8.4 % IV SOLN
INTRAVENOUS | Status: DC
  Filled 2022-01-28 (×8): qty 1000

## 2022-01-28 MED ORDER — EPINEPHRINE HCL 5 MG/250ML IV SOLN IN NS
0.5000 ug/min | INTRAVENOUS | Status: DC
  Filled 2022-01-28: qty 250

## 2022-01-28 MED ORDER — MEDIHONEY WOUND/BURN DRESSING EX PSTE
1.0000 | PASTE | Freq: Every day | CUTANEOUS | Status: DC
  Administered 2022-01-28 – 2022-01-31 (×4): 1 via TOPICAL
  Filled 2022-01-28 (×2): qty 44

## 2022-01-28 MED ORDER — HYDROCERIN EX CREA
TOPICAL_CREAM | Freq: Every day | CUTANEOUS | Status: DC
  Filled 2022-01-28: qty 113

## 2022-01-28 MED ORDER — ALBUTEROL SULFATE (2.5 MG/3ML) 0.083% IN NEBU
2.5000 mg | INHALATION_SOLUTION | Freq: Once | RESPIRATORY_TRACT | Status: AC
  Administered 2022-01-28: 2.5 mg via RESPIRATORY_TRACT

## 2022-01-28 MED ORDER — NOREPINEPHRINE 4 MG/250ML-% IV SOLN
0.0000 ug/min | INTRAVENOUS | Status: DC
  Administered 2022-01-28: 2 ug/min via INTRAVENOUS
  Administered 2022-01-28 (×2): 24 ug/min via INTRAVENOUS
  Filled 2022-01-28 (×3): qty 250

## 2022-01-28 MED ORDER — LIDOCAINE-PRILOCAINE 2.5-2.5 % EX CREA: 1.0000 | TOPICAL_CREAM | CUTANEOUS | Status: DC | PRN

## 2022-01-28 NOTE — ED Notes (Signed)
Paged hospitalist to inform of critical salicylate level.

## 2022-01-28 NOTE — Consult Note (Addendum)
Nephrology Consult   Assessment/Recommendations:   Salicylate Toxicity  -Salicylate level 8/29: 101.4 -indication for renal replacement therapy: acute salicylate toxicity w/ preserved kidney function w/ levels >100 -will need temp line and will start IHD today. Discussed this at length (including risks of HD) w/ daughter at the bedside and she agrees to proceed forward. CCM consulted -Appreciate assistance Dr. Okey Dupre for temp line placement today -agree with bicarb gtt (which can be held during HD but can restart after HD) -informed HD staff  AMS -likely related to salicylate + possible EtOH -CTH w/o acute findings -HD as above for sal toxicity  Anion Gap Metabolic Acidosis -hco3 gtt and HD plan as above  EtOH abuse -per primary service  [facility] 01/28/2022 11:07 AM   _____________________________________________________________________________________   History of Present Illness: Vincent Gaines is a/an 76 y.o. male with a past medical history of eczema, EtOH use, HLD, anxiety who presents to [facility] with anxiety and hyperventilating. Found out that he does have use alcohol on a daily basis. Brought in by his neighbor. Pertinent workup thus far revealed a bicarb of 18, pH 7.48 (pCO2 23), anion gap 16, Cr 9.37, WBC 16.9, salicylate level 678.9. Poison control has been contacted by ED who recommend bicarb infusion, activated charcoal (if possible), and consideration of hemodialysis. Daughter at bedside currently. She last saw him on Father's Day and everything was as usual according to her. She does report his history of extensive alcohol use and tobacco use as well. She reports that he has been dealing with eczema since a month before the [place] pandemic started and recently went to his dermatologist who rx'ed him prednisone earlier this week (has used this in the past according to chart review). She does report that he is supposed to take one baby  aspirin a day but she thought that he has not been taking in regularly for a few months now. She did not notice any open/empty bottles at home when she last saw him. ROS unobtainable given patient's mentation.    Medications:  Current Facility-Administered Medications  Medication Dose Route Frequency Provider Last Rate Last Admin   sodium bicarbonate 150 mEq in dextrose 5 % 1,150 mL infusion   Intravenous Continuous Hayden Rasmussen, MD       Current Outpatient Medications  Medication Sig Dispense Refill   ALPRAZolam (XANAX) 1 MG tablet Take 0.5-1 mg by mouth 2 (two) times daily as needed for anxiety.     Polyvinyl Alcohol (LIQUID TEARS OP) Apply 1 drop to eye daily with breakfast.     predniSONE (DELTASONE) 5 MG tablet Take 5-30 mg by mouth daily with breakfast. 6 day dose pack 6,5,4,3,2,1 and took the first dose on tuesday     simvastatin (ZOCOR) 40 MG tablet Take 40 mg by mouth daily with breakfast.     tiZANidine (ZANAFLEX) 4 MG tablet Take 1 tablet (4 mg total) by mouth every 6 (six) hours as needed for muscle spasms. 30 tablet 0   aspirin EC 81 MG tablet Take 81 mg by mouth daily. (Patient not taking: Reported on 01/28/2022)     fluconazole (DIFLUCAN) 200 MG tablet Take 1 tablet (200 mg total) by mouth daily. (Patient not taking: Reported on 01/28/2022) 6 tablet 0   hydrOXYzine (ATARAX/VISTARIL) 25 MG tablet Take 1 tablet (25 mg total) by mouth at bedtime as needed for itching. (Patient not taking: Reported on 01/28/2022) 30 tablet 0   meloxicam (MOBIC) 7.5 MG tablet Take 7.5  mg by mouth daily. (Patient not taking: Reported on 01/28/2022)     methylPREDNISolone (MEDROL DOSEPAK) 4 MG TBPK tablet Take by mouth taper from 4 doses each day to 1 dose and stop. (Patient not taking: Reported on 01/28/2022) 1 each 0   predniSONE (DELTASONE) 20 MG tablet Take 2 tablets daily with breakfast. (Patient not taking: Reported on 01/28/2022) 10 tablet 0     ALLERGIES Patient has no known  allergies.  MEDICAL HISTORY Past Medical History:  Diagnosis Date   Anxiety    Arthritis    osteoarthritis-knees   Bradycardia    hx. low pulse rate   Bronchitis 08-06-13   past bronchitis- phlegm issue in mornings. Uses E-cigarettes   Fractures    hx. rib fx.   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   Macular degeneration      SOCIAL HISTORY Social History   Socioeconomic History   Marital status: Single    Spouse name: Not on file   Number of children: Not on file   Years of education: Not on file   Highest education level: Not on file  Occupational History   Not on file  Tobacco Use   Smoking status: Former    Packs/day: 2.00    Years: 20.00    Total pack years: 40.00    Types: Cigarettes, E-cigarettes    Quit date: 02/05/2012    Years since quitting: 9.9   Smokeless tobacco: Never   Tobacco comments:    using E-cigs for 3-4 monthsas of 05/01/2015  Substance and Sexual Activity   Alcohol use: No    Comment: Quit   Drug use: No   Sexual activity: Yes  Other Topics Concern   Not on file  Social History Narrative   Not on file   Social Determinants of Health   Financial Resource Strain: Not on file  Food Insecurity: Not on file  Transportation Needs: Not on file  Physical Activity: Not on file  Stress: Not on file  Social Connections: Not on file  Intimate Partner Violence: Not on file     FAMILY HISTORY Family History  Problem Relation Age of Onset   Hypertension Other    Healthy Mother     Review of Systems: Unobtainable--AMS  Physical Exam: Vitals:   01/28/22 1030 01/28/22 1045  BP: (!) 92/56 (!) 89/60  Pulse: (!) 101 (!) 107  Resp: (!) 22 (!) 21  Temp:    SpO2: 97% 97%   Total I/O In: 1000 [IV Piggyback:1000] Out: -   Intake/Output Summary (Last 24 hours) at 01/28/2022 1107 Last data filed at 01/28/2022 7673 Gross per 24  hour  Intake 1000 ml  Output --  Net 1000 ml   General: ill appearing, lethargic, not following commands HEENT: anicteric sclera, oropharynx clear without lesions CV: tachycardic, no m/r/g appreciated Lungs: tachypenic, no w/r/r/c, bl chest expansion Abd: soft, non-tender, non-distended Skin: no visible lesions or rashes Musculoskeletal: no edema Neuro: lethargic, not following commands, opening eyes intermittently  Test Results Reviewed Lab Results  Component Value Date   NA 135 01/28/2022   K 3.6 01/28/2022   CL 101 01/28/2022   CO2 18 (L) 01/28/2022   BUN 17 01/28/2022   CREATININE 1.04 01/28/2022   CALCIUM 8.2 (L) 01/28/2022   ALBUMIN 4.0 08/06/2013     I have reviewed all relevant outside healthcare records related to  the patient's kidney injury.
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
## 2022-01-28 NOTE — ED Notes (Signed)
Tachypneic, diaphoretic, mumbling, unintelligible, shaky, delayed responses.

## 2022-01-28 NOTE — Plan of Care (Signed)

## 2022-01-28 NOTE — Progress Notes (Signed)
Patient's daughter brought patient's home meds including creams that he uses for his feet, he has been trying to treat his feet for yrs per daughter & hasn't had any resolution, both feet mainly heels are cracked white thick skin, when reviewing patient's home medications with his daughter she showed this RN a salicylic acid 09% ointment that was prescribed to him to apply to both feet at bedtime QD that he began using a few days ago on 01/25/2022, MD made aware

## 2022-01-28 NOTE — ED Notes (Signed)
No changes. VSS. EDP at [facility]. CT notified pt ready. 3rd NSL/ IV placed.
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
## 2022-01-28 NOTE — Assessment & Plan Note (Signed)
At home with simvastatin, that will be on hold for now.

## 2022-01-28 NOTE — ED Notes (Signed)
Mittens placed on patient to keep from pulling out central line

## 2022-01-28 NOTE — ED Notes (Signed)
Back from CT, VSS. Sleeping resting, NAD, calm. Sonorous resps. 96% RA.

## 2022-01-28 NOTE — Assessment & Plan Note (Addendum)
Patient with severe alcohol use and dependence per her daughter at the bedside.  Patient had 8 mg of lorazepam since this am.  At the time of my examination patient somnolent He has mittens in place.   Plan: To continue with alcohol withdrawal protocol with CIWA. Will keep current dosage protocol for now.  Continue thiamine.  IV fluids with dextrose.  Close neuro checks, aspiration precautions and fall precautions.

## 2022-01-28 NOTE — ED Notes (Signed)
Dr. Melina Copa EDP at [facility]

## 2022-01-28 NOTE — ED Provider Notes (Signed)
Signout from Dr. Roxanne Mins.  76 year old male brought in by EMS for shortness of breath.  Patient hyperventilating and anxious.  Work-up has been fairly unremarkable.  Getting delta troponin.  Plan is to reassess after Ativan.  Likely discharge if symptomatically improved. Physical Exam  BP 117/61   Pulse (!) 102   Temp 98.2 F (36.8 C)   Resp (!) 25   Ht '5\' 8"'$  (1.727 m)   Wt 79.4 kg   SpO2 98%   BMI 26.62 kg/m   Physical Exam  Procedures  .Critical Care  Performed by: Hayden Rasmussen, MD Authorized by: Hayden Rasmussen, MD   Critical care provider statement:    Critical care time (minutes):  90   Critical care time was exclusive of:  Separately billable procedures and treating other patients   Critical care was necessary to treat or prevent imminent or life-threatening deterioration of the following conditions:  Metabolic crisis, toxidrome, CNS failure or compromise and respiratory failure   Critical care was time spent personally by me on the following activities:  Development of treatment plan with patient or surrogate, discussions with consultants, evaluation of patient's response to treatment, examination of patient, obtaining history from patient or surrogate, ordering and performing treatments and interventions, ordering and review of laboratory studies, ordering and review of radiographic studies, pulse oximetry, re-evaluation of patient's condition and review of old charts   I assumed direction of critical care for this patient from another provider in my specialty: no     ED Course / MDM    Medical Decision Making Amount and/or Complexity of Data Reviewed Labs: ordered. Radiology: ordered.  Risk Prescription drug management. Decision regarding hospitalization.   Patient is altered.  Remains tachypneic and tachycardic.  He is diaphoretic.  His neighbor who is with him says he drinks daily but she does not think he has cut back at all.  She has never seen him act like  this.  I have ordered some more labs and fluids.  CT imaging of head chest abdomen and pelvis.  CT head and angio chest abdomen and pelvis do not show any signs of dissection no acute CNS findings.  Does have atelectasis versus infiltrate in lung base.  Labs coming back with significantly elevated salicylate level.  Reviewed case with poison control.  They are recommending activated charcoal if mental status allows.  If needs intubation will need hyperventilation.  Bicarb infusion 150 mEq with 40 mEq of KCl at 250 an hour.  Consideration for dialysis.  Nephrology paged.  Poison control also recommended keep potassium above 4.2.  11 AM discussed with Dr. Candiss Norse nephrology who will review his information and see if needs emergent dialysis.  12:15 PM.  Dr. Candiss Norse is evaluated patient and getting set up for dialysis.  He is reaching out to general surgery for temporary line.  Discussed with Dr. Cathlean Sauer [facility] hospitalist who will evaluate the patient for admission.  Reviewed case with Dr. Melvyn Novas critical care      Hayden Rasmussen, MD 01/28/22 [phone]

## 2022-01-28 NOTE — ED Notes (Signed)
Critical Lab: Salicylate level 722.5.  Provider notified.

## 2022-01-28 NOTE — ED Notes (Signed)
Nephrologist at bedside talking to Daughter

## 2022-01-28 NOTE — ED Triage Notes (Signed)
Pt arrives [facility] c/o sudden shortness of breath this morning. Pt placed on 1L [place] for comfort. Pt appears very anxious, and states it may all be his anxiety.

## 2022-01-28 NOTE — Procedures (Signed)
Procedure Note  01/28/2022   Preoperative Diagnosis: Salicylate toxicity   Postoperative Diagnosis: Same   Procedure(s) Performed: Right IJ temporary dialysis catheter placement with ultrasound guidance   Surgeon: Graciella Freer, DO   Assistants: None   Anesthesia: 1% lidocaine    Complications: None    Indications: Vincent Gaines is a 76 y.o. w who presented to the hospital with shortness of breath and anxiety.  He was noted to have salicylate toxicity.  Nephrology has decided to dialyze the patient and he will need temporary dialysis catheter placement.. I discussed the risk and benefits of placement of temporary dialysis catheter with the patient's daughter, Vincent Gaines, including but not limited to bleeding, infection, injury to surrounding structures, and risk of pneumothorax.  She has given verbal and written consent for the procedure.    Procedure: The patient placed supine. The right chest and neck was prepped and draped in the usual sterile fashion.  Wearing full gown and gloves, I performed the procedure.  One percent lidocaine was used for local anesthesia. An ultrasound was utilized to assess the jugular vein.  The needle with syringe was advanced into the jugular vein with dark venous return, and a wire was placed using the Seldinger technique without difficulty.  Ectopia was not noted.  The wire was noted to be within the right jugular vein on ultrasound.  The skin was knicked and a dilator was placed, and the trialysis catheter was placed over the wire with continued control of the wire.  There was good draw back of blood from all three lumens and each flushed easily with saline.  The catheter was secured in 2 points with 2-0 silk and a biopatch and dressing was placed.  The two hemodialysis lines were then instilled with 1.4 cc of 1000 units/mL of heparin each.  The patient tolerated the procedure well, and the CXR was ordered to confirm position of the central  line.   Graciella Freer, DO [facility] [address] [place], [place] [address] [phone] (office)

## 2022-01-28 NOTE — Consult Note (Addendum)
[place] Nurse Consult Note: Reason for Consult: Consult requested for bilat feet.  Performed remotely after review of progress notes and photos in the EMR. Pt has dry crusted loose skin to bilat plantar feet and heels.  There are patchy areas which are black and dry, especially to left and right plantar heels.  Unable to determine if eventually there will be a full thickness wound underneath as loose skin is lifted and removed as area evolves.  Dressing procedure/placement/frequency: Topical treatment orders provided for bedside nurses to perfrom as follows to assist with removal of nonviable skin: Float heels to reduce pressure.  1. Apply Medihoney to right plantar heel Q day, then cover with gauze and foam dressing.  (Change foam dressings Q 3 days or PRN soiling.) 2. Apply Eucerin to bilat legs and feet Q day after bathing, then roughly towel-drying to assist with removal of loose skin Please re-consult if further assistance is needed.  Thank-you,  Julien Girt MSN, [place], [place], [place], [place]

## 2022-01-28 NOTE — Progress Notes (Signed)
Called ED to obtain report on pt coming to ICU 6, RN in pt room at this time & will call this RN back

## 2022-01-28 NOTE — Assessment & Plan Note (Addendum)
Patient with acute respiratory alkalosis and positive anion gap metabolic acidosis consistent with salicylate intoxication He has altered mentation consistent with acute metabolic and toxic encephalopathy. Poison control has been contacted and had recommended urgent hemodialysis.  Nephrology and surgery have been consulted.   Plan to admit patient to ICU. Currently patient is protecting his airway, but will need close monitoring. Continue with bicarbonate IV infusion until starting renal replacement therapy.

## 2022-01-28 NOTE — Progress Notes (Signed)
Patient arrived to ICU unit, very diaphoretic, not responding to questions, red rash areas to whole body, both feet/heels covered in thick/cracked white type skin, breathing rapidly, HD cath drsg peeling away from skin & barely on - HD cath drsg changed, HD RN made aware of patient's arrival to unit & order for emergent HD, HR RN setting up to begin HD at this time,  Vincent Gaines daughter in family room at this time & aware

## 2022-01-29 DIAGNOSIS — E876 Hypokalemia: Secondary | ICD-10-CM | POA: Diagnosis not present

## 2022-01-29 DIAGNOSIS — T39094A Poisoning by salicylates, undetermined, initial encounter: Secondary | ICD-10-CM | POA: Diagnosis not present

## 2022-01-29 DIAGNOSIS — L309 Dermatitis, unspecified: Secondary | ICD-10-CM | POA: Diagnosis not present

## 2022-01-29 DIAGNOSIS — F10931 Alcohol use, unspecified with withdrawal delirium: Secondary | ICD-10-CM | POA: Diagnosis not present

## 2022-01-29 DIAGNOSIS — D72829 Elevated white blood cell count, unspecified: Secondary | ICD-10-CM | POA: Diagnosis present

## 2022-01-29 LAB — URINALYSIS, ROUTINE W REFLEX MICROSCOPIC
Bacteria, UA: NONE SEEN
Bilirubin Urine: NEGATIVE
Glucose, UA: 50 mg/dL — AB
Ketones, ur: 20 mg/dL — AB
Nitrite: NEGATIVE
Protein, ur: 100 mg/dL — AB
RBC / HPF: >50 RBC/hpf — ABNORMAL HIGH (ref 0–5)
Specific Gravity, Urine: 1.023 (ref 1.005–1.030)
WBC, UA: >50 WBC/hpf — ABNORMAL HIGH (ref 0–5)
pH: 5 (ref 5.0–8.0)

## 2022-01-29 LAB — HEPATITIS B SURFACE ANTIBODY, QUANTITATIVE: Hep B S AB Quant (Post): <3.1 m[IU]/mL — ABNORMAL LOW (ref >9.9)

## 2022-01-29 LAB — CBC
HCT: 34.2 % — ABNORMAL LOW (ref 39.0–52.0)
Hemoglobin: 12.2 g/dL — ABNORMAL LOW (ref 13.0–17.0)
MCH: 36.1 pg — ABNORMAL HIGH (ref 26.0–34.0)
MCHC: 35.7 g/dL (ref 30.0–36.0)
MCV: 101.2 fL — ABNORMAL HIGH (ref 80.0–100.0)
Platelets: 221 10*3/uL (ref 150–400)
RBC: 3.38 MIL/uL — ABNORMAL LOW (ref 4.22–5.81)
RDW: 14.4 % (ref 11.5–15.5)
WBC: 19.9 10*3/uL — ABNORMAL HIGH (ref 4.0–10.5)
nRBC: 0 % (ref 0.0–0.2)

## 2022-01-29 LAB — BASIC METABOLIC PANEL
Anion gap: 7 (ref 5–15)
BUN: 15 mg/dL (ref 8–23)
CO2: 31 mmol/L (ref 22–32)
Calcium: 7.1 mg/dL — ABNORMAL LOW (ref 8.9–10.3)
Chloride: 97 mmol/L — ABNORMAL LOW (ref 98–111)
Creatinine, Ser: 0.89 mg/dL (ref 0.61–1.24)
GFR, Estimated: >60 mL/min (ref >60)
Glucose, Bld: 123 mg/dL — ABNORMAL HIGH (ref 70–99)
Potassium: 3.3 mmol/L — ABNORMAL LOW (ref 3.5–5.1)
Sodium: 135 mmol/L (ref 135–145)

## 2022-01-29 LAB — SALICYLATE LEVEL
Salicylate Lvl: 44.5 mg/dL (ref 7.0–30.0)
Salicylate Lvl: 38.7 mg/dL — ABNORMAL HIGH (ref 7.0–30.0)
Salicylate Lvl: 34.7 mg/dL — ABNORMAL HIGH (ref 7.0–30.0)
Salicylate Lvl: 34.6 mg/dL — ABNORMAL HIGH (ref 7.0–30.0)
Salicylate Lvl: 29.3 mg/dL (ref 7.0–30.0)

## 2022-01-29 LAB — MRSA NEXT GEN BY PCR, NASAL: MRSA by PCR Next Gen: NOT DETECTED

## 2022-01-29 LAB — MAGNESIUM: Magnesium: 2 mg/dL (ref 1.7–2.4)

## 2022-01-29 MED ORDER — SODIUM CHLORIDE 0.9 % IV SOLN
1.0000 g | INTRAVENOUS | Status: DC
  Administered 2022-01-29 – 2022-01-30 (×2): 1 g via INTRAVENOUS
  Filled 2022-01-29 (×2): qty 10

## 2022-01-29 MED ORDER — DEXTROSE IN LACTATED RINGERS 5 % IV SOLN: INTRAVENOUS | Status: DC

## 2022-01-29 MED ORDER — POTASSIUM CHLORIDE 10 MEQ/100ML IV SOLN
10.0000 meq | INTRAVENOUS | Status: AC
  Administered 2022-01-30 (×4): 10 meq via INTRAVENOUS
  Filled 2022-01-29 (×4): qty 100

## 2022-01-29 MED ORDER — SODIUM BICARBONATE 8.4 % IV SOLN
INTRAVENOUS | Status: DC
  Filled 2022-01-29 (×5): qty 1000

## 2022-01-29 MED ORDER — POTASSIUM CHLORIDE 10 MEQ/100ML IV SOLN
10.0000 meq | INTRAVENOUS | Status: AC
  Administered 2022-01-29 (×4): 10 meq via INTRAVENOUS
  Filled 2022-01-29 (×4): qty 100

## 2022-01-29 MED ORDER — NOREPINEPHRINE 16 MG/250ML-% IV SOLN
0.0000 ug/min | INTRAVENOUS | Status: DC
  Administered 2022-01-29: 12 ug/min via INTRAVENOUS
  Administered 2022-01-29: 25 ug/min via INTRAVENOUS
  Filled 2022-01-29 (×2): qty 250

## 2022-01-29 NOTE — Assessment & Plan Note (Signed)
Suspected reactive leukocytosis. Patient had oral systemic steroids as outpatient.   Plan to continue close follow up cell count and temperature curve.  Hold on antibiotic therapy for now.

## 2022-01-29 NOTE — Progress Notes (Addendum)
Follow up salicylate level is 34.6 down from 34.7. Urine analysis with pH of 5, 100 protein, > 50 rbc, >50 wbc.  Patient more awake and alert.   Plan to continue bicarb drip for the next 24 hrs Add antibiotic therapy for urine infection.  Add clear liquids as tolerated.

## 2022-01-30 DIAGNOSIS — T39094A Poisoning by salicylates, undetermined, initial encounter: Secondary | ICD-10-CM | POA: Diagnosis not present

## 2022-01-30 DIAGNOSIS — F10931 Alcohol use, unspecified with withdrawal delirium: Secondary | ICD-10-CM | POA: Diagnosis not present

## 2022-01-30 DIAGNOSIS — E785 Hyperlipidemia, unspecified: Secondary | ICD-10-CM | POA: Diagnosis not present

## 2022-01-30 DIAGNOSIS — A419 Sepsis, unspecified organism: Secondary | ICD-10-CM

## 2022-01-30 DIAGNOSIS — E876 Hypokalemia: Secondary | ICD-10-CM | POA: Diagnosis not present

## 2022-01-30 DIAGNOSIS — N390 Urinary tract infection, site not specified: Secondary | ICD-10-CM

## 2022-01-30 LAB — SALICYLATE LEVEL: Salicylate Lvl: 20.5 mg/dL (ref 7.0–30.0)

## 2022-01-30 LAB — RENAL FUNCTION PANEL
Albumin: 2.8 g/dL — ABNORMAL LOW (ref 3.5–5.0)
Anion gap: 4 — ABNORMAL LOW (ref 5–15)
BUN: 8 mg/dL (ref 8–23)
CO2: 37 mmol/L — ABNORMAL HIGH (ref 22–32)
Calcium: 7.6 mg/dL — ABNORMAL LOW (ref 8.9–10.3)
Chloride: 97 mmol/L — ABNORMAL LOW (ref 98–111)
Creatinine, Ser: 0.66 mg/dL (ref 0.61–1.24)
GFR, Estimated: >60 mL/min (ref >60)
Glucose, Bld: 125 mg/dL — ABNORMAL HIGH (ref 70–99)
Phosphorus: 1.3 mg/dL — ABNORMAL LOW (ref 2.5–4.6)
Potassium: 3.3 mmol/L — ABNORMAL LOW (ref 3.5–5.1)
Sodium: 138 mmol/L (ref 135–145)

## 2022-01-30 LAB — BASIC METABOLIC PANEL
Anion gap: 7 (ref 5–15)
BUN: 10 mg/dL (ref 8–23)
CO2: 34 mmol/L — ABNORMAL HIGH (ref 22–32)
Calcium: 7.2 mg/dL — ABNORMAL LOW (ref 8.9–10.3)
Chloride: 96 mmol/L — ABNORMAL LOW (ref 98–111)
Creatinine, Ser: 0.68 mg/dL (ref 0.61–1.24)
GFR, Estimated: >60 mL/min (ref >60)
Glucose, Bld: 129 mg/dL — ABNORMAL HIGH (ref 70–99)
Potassium: 2.9 mmol/L — ABNORMAL LOW (ref 3.5–5.1)
Sodium: 137 mmol/L (ref 135–145)

## 2022-01-30 LAB — GLUCOSE, CAPILLARY: Glucose-Capillary: 79 mg/dL (ref 70–99)

## 2022-01-30 LAB — CBC WITH DIFFERENTIAL/PLATELET
Abs Immature Granulocytes: 0.05 10*3/uL (ref 0.00–0.07)
Basophils Absolute: 0.1 10*3/uL (ref 0.0–0.1)
Basophils Relative: 1 %
Eosinophils Absolute: 0 10*3/uL (ref 0.0–0.5)
Eosinophils Relative: 0 %
HCT: 33 % — ABNORMAL LOW (ref 39.0–52.0)
Hemoglobin: 11.5 g/dL — ABNORMAL LOW (ref 13.0–17.0)
Immature Granulocytes: 0 %
Lymphocytes Relative: 7 %
Lymphs Abs: 0.9 10*3/uL (ref 0.7–4.0)
MCH: 36.3 pg — ABNORMAL HIGH (ref 26.0–34.0)
MCHC: 34.8 g/dL (ref 30.0–36.0)
MCV: 104.1 fL — ABNORMAL HIGH (ref 80.0–100.0)
Monocytes Absolute: 1.5 10*3/uL — ABNORMAL HIGH (ref 0.1–1.0)
Monocytes Relative: 13 %
Neutro Abs: 9.5 10*3/uL — ABNORMAL HIGH (ref 1.7–7.7)
Neutrophils Relative %: 79 %
Platelets: 211 10*3/uL (ref 150–400)
RBC: 3.17 MIL/uL — ABNORMAL LOW (ref 4.22–5.81)
RDW: 14.9 % (ref 11.5–15.5)
WBC: 12 10*3/uL — ABNORMAL HIGH (ref 4.0–10.5)
nRBC: 0 % (ref 0.0–0.2)

## 2022-01-30 MED ORDER — HYDRALAZINE HCL 20 MG/ML IJ SOLN: 10.0000 mg | INTRAMUSCULAR | Status: DC | PRN

## 2022-01-30 MED ORDER — SALINE SPRAY 0.65 % NA SOLN: 1.0000 | NASAL | Status: DC | PRN

## 2022-01-30 MED ORDER — CHLORDIAZEPOXIDE HCL 5 MG PO CAPS
25.0000 mg | ORAL_CAPSULE | Freq: Two times a day (BID) | ORAL | Status: AC
  Administered 2022-01-31 – 2022-02-01 (×2): 25 mg via ORAL
  Filled 2022-01-30 (×2): qty 5

## 2022-01-30 MED ORDER — POTASSIUM PHOSPHATES 15 MMOLE/5ML IV SOLN
30.0000 mmol | Freq: Once | INTRAVENOUS | Status: AC
  Administered 2022-01-30: 30 mmol via INTRAVENOUS
  Filled 2022-01-30: qty 10

## 2022-01-30 MED ORDER — METOPROLOL TARTRATE 5 MG/5ML IV SOLN: 5.0000 mg | INTRAVENOUS | Status: DC | PRN

## 2022-01-30 MED ORDER — POTASSIUM CHLORIDE 10 MEQ/100ML IV SOLN: 10.0000 meq | INTRAVENOUS | Status: DC

## 2022-01-30 MED ORDER — SODIUM PHOSPHATES 45 MMOLE/15ML IV SOLN: 30.0000 mmol | Freq: Once | INTRAVENOUS | Status: DC

## 2022-01-30 MED ORDER — CHLORDIAZEPOXIDE HCL 25 MG PO CAPS
25.0000 mg | ORAL_CAPSULE | Freq: Three times a day (TID) | ORAL | Status: AC
  Administered 2022-01-30 – 2022-01-31 (×3): 25 mg via ORAL
  Filled 2022-01-30 (×3): qty 1

## 2022-01-30 MED ORDER — SENNOSIDES-DOCUSATE SODIUM 8.6-50 MG PO TABS: 1.0000 | ORAL_TABLET | Freq: Every evening | ORAL | Status: DC | PRN

## 2022-01-30 MED ORDER — CHLORDIAZEPOXIDE HCL 5 MG PO CAPS: 25.0000 mg | ORAL_CAPSULE | Freq: Every day | ORAL | Status: DC

## 2022-01-30 MED ORDER — GUAIFENESIN 100 MG/5ML PO LIQD: 5.0000 mL | ORAL | Status: DC | PRN

## 2022-01-30 NOTE — Progress Notes (Signed)
PROGRESS NOTE    Vincent Gaines  ZOX:[id number] DOB: 09/19/1945 DOA: 01/28/2022 PCP: Benita Stabile, MD   Brief Narrative:  76 year old with history of eczema and alcohol abuse presents from home for increasing work of breathing, shortness of breath and altered mental status.  Patient has been drinking heavily for the past few days prior to admission.  Upon admission noted to be tachycardic and tachypneic.  He was also confused.  Upon admission CT head was negative, chest x-ray showed mild vascular congestion, CT chest showed bilateral groundglass opacities.  His salicylate levels were elevated requiring urgent hemodialysis.  Nephrology team was consulted.   Assessment & Plan:  Principal Problem:   Salicylate intoxication Active Problems:   Alcohol withdrawal (HCC)   Dyslipidemia   Eczema   T12 compression fracture (HCC)   Hypokalemia   Leukocytosis   Sepsis secondary to UTI (HCC)     Assessment and Plan: * Salicylate intoxication -Leading to tachypnea, tachycardia, metabolic acidosis.  Urgent right IJ catheter placed for hemodialysis.  Nephrology team consulted.  Bicarb infusion will be turned off once cleared by poison control. - Salicylate level slowly trending down.  Continue to monitor this.  Alcohol withdrawal (HCC) -Heavy alcohol use.  Alcohol withdrawal protocol - Thiamine, folic acid, multivitamin.  Librium taper added  Sepsis secondary to urinary tract infection, POA - Sepsis evidenced by leukocytosis, tachycardia and tachypnea with UTI as source.  Some of the symptoms could also be from salicylate toxicity.  Follow culture data.  On empiric IV Rocephin.  Dyslipidemia -Statin on hold  Eczema Patient with chronic skin changes in bilateral feet.  Topical antifungals   T12 compression fracture (HCC) -Unclear if this is acute or chronic.  Symptom management.  Hypokalemia/hyponatremia/hypophosphatemia -Monitor and replete as necessary     DVT prophylaxis:  Lovenox Code Status: Full code Family Communication:    Status is: Inpatient Remains inpatient appropriate because: Maintain hospital stay as patient is off-and-on requiring pressors in the ICU.  Still undergoing signs of withdrawal.  Not consistently tolerating oral.   Nutritional status          Body mass index is 24.74 kg/m.         Subjective: Seen and examined at bedside.  Patient is quite poor historian.  Answers basic questions but at times goes off on a tangent.  Tolerating orals slowly   Examination:  General exam: Appears calm and comfortable  Respiratory system: Clear to auscultation. Respiratory effort normal. Cardiovascular system: S1 & S2 heard, RRR. No JVD, murmurs, rubs, gallops or clicks. No pedal edema. Gastrointestinal system: Abdomen is nondistended, soft and nontender. No organomegaly or masses felt. Normal bowel sounds heard. Central nervous system: Alert to name and place.  No focal neurodeficits. Extremities: Symmetric 5 x 5 power. Skin: Bilateral lower extremity dry skin Psychiatry: Poor judgment and insight Right IJ HD catheter in place  Objective: Vitals:   01/30/22 0700 01/30/22 0715 01/30/22 0730 01/30/22 0745  BP: 127/62 (!) 139/59 131/84 116/70  Pulse: 70 63 73 69  Resp: (!) 22 20 (!) 24 (!) 21  Temp: (!) 97.4 F (36.3 C)     TempSrc: Oral     SpO2: 100% 100% 100% 99%  Weight:      Height:        Intake/Output Summary (Last 24 hours) at 01/30/2022 0901 Last data filed at 01/30/2022 0801 Gross per 24 hour  Intake 3488.91 ml  Output 1025 ml  Net 2463.91 ml   [employer]  01/28/22 1749 01/28/22 2004 01/30/22 0600  Weight: 71.2 kg 71.2 kg 73.8 kg     Data Reviewed:   CBC: Recent Labs  Lab 01/28/22 0516 01/29/22 0352 01/30/22 0546  WBC 16.6* 19.9* 12.0*  NEUTROABS 13.9*  --  9.5*  HGB 13.8 12.2* 11.5*  HCT 38.0* 34.2* 33.0*  MCV 99.7 101.2* 104.1*  PLT 276 221 211   Basic Metabolic Panel: Recent Labs  Lab  01/28/22 1222 01/28/22 2258 01/29/22 0352 01/29/22 1402 01/29/22 2327 01/30/22 0546  NA 137 134* 135  --  137 138  K 3.9 2.7* 3.3*  --  2.9* 3.3*  CL 102 95* 97*  --  96* 97*  CO2 16* 29 31  --  34* 37*  GLUCOSE 136* 214* 123*  --  129* 125*  BUN 19 13 15   --  10 8  CREATININE 1.11 0.80 0.89  --  0.68 0.66  CALCIUM 7.3* 7.2* 7.1*  --  7.2* 7.6*  MG  --   --   --  2.0  --   --   PHOS  --   --   --   --   --  1.3*   GFR: Estimated Creatinine Clearance: 77.2 mL/min (by C-G formula based on SCr of 0.66 mg/dL). Liver Function Tests: Recent Labs  Lab 01/30/22 0546  ALBUMIN 2.8*   No results for input(s): "LIPASE", "AMYLASE" in the last 168 hours. Recent Labs  Lab 01/28/22 0852  AMMONIA 13   Coagulation Profile: No results for input(s): "INR", "PROTIME" in the last 168 hours. Cardiac Enzymes: No results for input(s): "CKTOTAL", "CKMB", "CKMBINDEX", "TROPONINI" in the last 168 hours. BNP (last 3 results) No results for input(s): "PROBNP" in the last 8760 hours. HbA1C: No results for input(s): "HGBA1C" in the last 72 hours. CBG: Recent Labs  Lab 01/28/22 1114  GLUCAP 99   Lipid Profile: No results for input(s): "CHOL", "HDL", "LDLCALC", "TRIG", "CHOLHDL", "LDLDIRECT" in the last 72 hours. Thyroid Function Tests: No results for input(s): "TSH", "T4TOTAL", "FREET4", "T3FREE", "THYROIDAB" in the last 72 hours. Anemia Panel: No results for input(s): "VITAMINB12", "FOLATE", "FERRITIN", "TIBC", "IRON", "RETICCTPCT" in the last 72 hours. Sepsis Labs: Recent Labs  Lab 01/28/22 0835  LATICACIDVEN 0.9    Recent Results (from the past 240 hour(s))  Culture, blood (routine x 2)     Status: None (Preliminary result)   Collection Time: 01/28/22  8:35 AM   Specimen: BLOOD LEFT ARM  Result Value Ref Range Status   Specimen Description BLOOD LEFT ARM  Final   Special Requests   Final    BOTTLES DRAWN AEROBIC AND ANAEROBIC Blood Culture adequate volume   Culture   Final    NO  GROWTH 2 DAYS Performed at [facility], [address] [place], [place] [address]    Report Status PENDING  Incomplete  Culture, blood (routine x 2)     Status: None (Preliminary result)   Collection Time: 01/28/22  8:42 AM   Specimen: BLOOD LEFT HAND  Result Value Ref Range Status   Specimen Description BLOOD LEFT HAND  Final   Special Requests   Final    BOTTLES DRAWN AEROBIC AND ANAEROBIC Blood Culture adequate volume   Culture   Final    NO GROWTH 2 DAYS Performed at [facility], [address], [place], [place] [address]    Report Status PENDING  Incomplete  SARS Coronavirus 2 by RT PCR (hospital order, performed in [facility] hospital lab) *cepheid single result  test* Anterior Nasal Swab     Status: None   Collection Time: 01/28/22 11:39 AM   Specimen: Anterior Nasal Swab  Result Value Ref Range Status   SARS Coronavirus 2 by RT PCR NEGATIVE NEGATIVE Final    Comment: (NOTE) SARS-CoV-2 target nucleic acids are NOT DETECTED.  The SARS-CoV-2 RNA is generally detectable in upper and lower respiratory specimens during the acute phase of infection. The lowest concentration of SARS-CoV-2 viral copies this assay can detect is 250 copies / mL. A negative result does not preclude SARS-CoV-2 infection and should not be used as the sole basis for treatment or other patient management decisions.  A negative result may occur with improper specimen collection / handling, submission of specimen other than nasopharyngeal swab, presence of viral mutation(s) within the areas targeted by this assay, and inadequate number of viral copies (<250 copies / mL). A negative result must be combined with clinical observations, patient history, and epidemiological information.  Fact Sheet for Patients:   RoadLapTop.co.za  Fact Sheet for Healthcare Providers: http://kim-miller.com/  This test is not yet approved or  cleared by the [place] FDA  and has been authorized for detection and/or diagnosis of SARS-CoV-2 by FDA under an Emergency Use Authorization (EUA).  This EUA will remain in effect (meaning this test can be used) for the duration of the COVID-19 declaration under Section 564(b)(1) of the Act, 21 U.S.C. section 360bbb-3(b)(1), unless the authorization is terminated or revoked sooner.  Performed at [facility], [address] [place], [place] [address]   MRSA Next Gen by PCR, Nasal     Status: None   Collection Time: 01/28/22  3:27 PM   Specimen: Nasal Mucosa; Nasal Swab  Result Value Ref Range Status   MRSA by PCR Next Gen NOT DETECTED NOT DETECTED Final    Comment: (NOTE) The GeneXpert MRSA Assay (FDA approved for NASAL specimens only), is one component of a comprehensive MRSA colonization surveillance program. It is not intended to diagnose MRSA infection nor to guide or monitor treatment for MRSA infections. Test performance is not FDA approved in patients less than 62 years old. Performed at [facility], [address], [place], [place] [address]          Radiology Studies: DG Chest Portable 1 View  Result Date: 01/28/2022 CLINICAL DATA:  Line placement verification EXAM: PORTABLE CHEST 1 VIEW COMPARISON:  01/28/2022 FINDINGS: Right jugular central venous catheter tip at the cavoatrial junction. No pneumothorax. Elevated right hemidiaphragm with mild right lower lobe atelectasis unchanged. Mild atelectasis or scarring left lung base unchanged Chronic rib fractures bilaterally. IMPRESSION: Satisfactory central line placement.  No acute abnormality Electronically Signed   By: Marlan Palau M.D.   On: 01/28/2022 14:02   CT Angio Chest/Abd/Pel for Dissection W and/or W/WO  Result Date: 01/28/2022 CLINICAL DATA:  Shortness of breath, acute aortic syndrome suspected EXAM: CT ANGIOGRAPHY CHEST, ABDOMEN AND PELVIS TECHNIQUE: Non-contrast CT of the chest was initially obtained. Multidetector CT imaging through  the chest, abdomen and pelvis was performed using the standard protocol during bolus administration of intravenous contrast. Multiplanar reconstructed images and MIPs were obtained and reviewed to evaluate the vascular anatomy. RADIATION DOSE REDUCTION: This exam was performed according to the departmental dose-optimization program which includes automated exposure control, adjustment of the mA and/or kV according to patient size and/or use of iterative reconstruction technique. CONTRAST:  OMNIPAQUE IOHEXOL 350 MG/ML SOLN COMPARISON:  Chest radiograph done earlier today FINDINGS: CTA CHEST FINDINGS There is  patient motion in many of the images limiting the study. Cardiovascular: There is no demonstrable intramural hematoma in the noncontrast images of chest. There is homogeneous enhancement in the thoracic aorta. Scattered atherosclerotic plaques and calcifications are seen in the thoracic aorta and its major branches. This finding is particularly prominent in the proximal course of left subclavian artery. There are scattered coronary artery calcifications. There are no intraluminal filling defects in the central pulmonary artery branches. Evaluation of peripheral pulmonary artery branches is limited by motion artifacts. Mediastinum/Nodes: No significant lymphadenopathy seen. Lungs/Pleura: Motion artifacts limit evaluation. As far as seen, there is no focal pulmonary consolidation. There are small linear patchy infiltrates in the lower lung fields more so in the right lower lobe. There is no significant pleural effusion or pneumothorax. Musculoskeletal: There are few old healed rib fractures on both sides. Review of the MIP images confirms the above findings. CTA ABDOMEN AND PELVIS FINDINGS VASCULAR Aorta: There are scattered atherosclerotic plaques and calcifications. There is no demonstrable dissection. There is no evidence of retroperitoneal hematoma. Celiac: No significant stenosis. SMA: No significant  stenosis. Renals: Unremarkable. IMA: Patent. Inflow: Atherosclerotic plaques and calcifications are seen in the common iliac arteries, more so on the left side. There is no evidence of high-grade stenosis in the iliac arteries. Veins: Unremarkable. Review of the MIP images confirms the above findings. NON-VASCULAR Hepatobiliary: There is fatty infiltration in the liver. Motion artifacts limit evaluation. There is no dilation of bile ducts. Gallbladder is unremarkable. Pancreas: No focal abnormality is seen. Spleen: Spleen is not distinctly visualized. Adrenals/Urinary Tract: Adrenals are unremarkable. There is no hydronephrosis. There are no renal or ureteral stones. Urinary bladder is unremarkable. Stomach/Bowel: Stomach is not distended. Small-bowel loops are unremarkable. Appendix is not dilated. There is no significant wall thickening in colon. Scattered diverticula are seen in colon without signs of focal diverticulitis. Lymphatic: Unremarkable. Reproductive: Prostate is enlarged. Other: There is no ascites or pneumoperitoneum. Musculoskeletal: There is 60% decrease in height of body of T12 vertebra. Degenerative changes are noted in the lumbar spine, particularly severe at at L4-L5 and L5-S1 levels with spinal stenosis and encroachment of neural foramina. Review of the MIP images confirms the above findings. IMPRESSION: Motion limited study. There is no evidence of dissection in the thoracic and abdominal aorta. Major branches of thoracic and abdominal aorta appear patent. Coronary artery calcifications are seen. Scattered calcifications and atherosclerotic plaques are seen in thoracic aorta, abdominal aorta and their branches. There is no focal pulmonary consolidation. Small linear patchy infiltrates are noted in the lower lung fields suggesting subsegmental atelectasis or pneumonia. There is compression fracture in the body of T12 vertebra which may be recent or old. Fatty liver. Diverticulosis of colon  without signs of diverticulitis. Enlarged prostate. Lumbar spondylosis. Other findings as described in the body of the report. Electronically Signed   By: Ernie Avena M.D.   On: 01/28/2022 10:26   CT Head Wo Contrast  Result Date: 01/28/2022 CLINICAL DATA:  Mental status change, unknown cause EXAM: CT HEAD WITHOUT CONTRAST TECHNIQUE: Contiguous axial images were obtained from the base of the skull through the vertex without intravenous contrast. RADIATION DOSE REDUCTION: This exam was performed according to the departmental dose-optimization program which includes automated exposure control, adjustment of the mA and/or kV according to patient size and/or use of iterative reconstruction technique. COMPARISON:  None Available. FINDINGS: Brain: There is no acute intracranial hemorrhage, mass effect, or edema. Gray-white differentiation is preserved. There is no extra-axial fluid collection.  Prominence of the ventricles and sulci reflects mild parenchymal volume loss. Patchy hypoattenuation in the supratentorial white matter is nonspecific but may reflect mild to moderate chronic microvascular ischemic changes. Vascular: There is atherosclerotic calcification at the skull base. Skull: Calvarium is unremarkable. Sinuses/Orbits: No acute finding. Other: None. IMPRESSION: No acute intracranial abnormality. Chronic microvascular ischemic changes. Electronically Signed   By: Guadlupe Spanish M.D.   On: 01/28/2022 10:07        Scheduled Meds:  Chlorhexidine Gluconate Cloth  6 each Topical Daily   enoxaparin (LOVENOX) injection  40 mg Subcutaneous Q24H   hydrocerin   Topical Daily   leptospermum manuka honey  1 Application Topical Daily   nystatin cream   Topical BID   pantoprazole (PROTONIX) IV  40 mg Intravenous Q24H   Continuous Infusions:  anticoagulant sodium citrate     cefTRIAXone (ROCEPHIN)  IV Stopped (01/29/22 1824)   norepinephrine (LEVOPHED) Adult infusion 6 mcg/min (01/30/22 0801)    potassium chloride     sodium phosphate 30 mmol in dextrose 5 % 250 mL infusion     thiamine injection 250 mg (01/30/22 0818)     LOS: 2 days   Time spent= 35 mins    Sedra Morfin Joline Maxcy, MD [facility]  If 7PM-7AM, please contact night-coverage  01/30/2022, 9:01 AM

## 2022-01-31 ENCOUNTER — Other Ambulatory Visit: Payer: Self-pay

## 2022-01-31 DIAGNOSIS — L309 Dermatitis, unspecified: Secondary | ICD-10-CM | POA: Diagnosis not present

## 2022-01-31 DIAGNOSIS — E785 Hyperlipidemia, unspecified: Secondary | ICD-10-CM | POA: Diagnosis not present

## 2022-01-31 DIAGNOSIS — F10931 Alcohol use, unspecified with withdrawal delirium: Secondary | ICD-10-CM | POA: Diagnosis not present

## 2022-01-31 DIAGNOSIS — T39094A Poisoning by salicylates, undetermined, initial encounter: Secondary | ICD-10-CM | POA: Diagnosis not present

## 2022-01-31 LAB — CBC
HCT: 33.5 % — ABNORMAL LOW (ref 39.0–52.0)
Hemoglobin: 11.3 g/dL — ABNORMAL LOW (ref 13.0–17.0)
MCH: 35.5 pg — ABNORMAL HIGH (ref 26.0–34.0)
MCHC: 33.7 g/dL (ref 30.0–36.0)
MCV: 105.3 fL — ABNORMAL HIGH (ref 80.0–100.0)
Platelets: 197 10*3/uL (ref 150–400)
RBC: 3.18 MIL/uL — ABNORMAL LOW (ref 4.22–5.81)
RDW: 14.6 % (ref 11.5–15.5)
WBC: 8.8 10*3/uL (ref 4.0–10.5)
nRBC: 0 % (ref 0.0–0.2)

## 2022-01-31 LAB — BASIC METABOLIC PANEL
Anion gap: 5 (ref 5–15)
BUN: 6 mg/dL — ABNORMAL LOW (ref 8–23)
CO2: 35 mmol/L — ABNORMAL HIGH (ref 22–32)
Calcium: 8.1 mg/dL — ABNORMAL LOW (ref 8.9–10.3)
Chloride: 94 mmol/L — ABNORMAL LOW (ref 98–111)
Creatinine, Ser: 0.58 mg/dL — ABNORMAL LOW (ref 0.61–1.24)
GFR, Estimated: >60 mL/min (ref >60)
Glucose, Bld: 97 mg/dL (ref 70–99)
Potassium: 3.3 mmol/L — ABNORMAL LOW (ref 3.5–5.1)
Sodium: 134 mmol/L — ABNORMAL LOW (ref 135–145)

## 2022-01-31 LAB — PHOSPHORUS: Phosphorus: 2 mg/dL — ABNORMAL LOW (ref 2.5–4.6)

## 2022-01-31 LAB — CULTURE, BLOOD (ROUTINE X 2)

## 2022-01-31 LAB — MAGNESIUM: Magnesium: 2.1 mg/dL (ref 1.7–2.4)

## 2022-01-31 MED ORDER — SODIUM CHLORIDE 0.9 % IV SOLN
1.0000 g | INTRAVENOUS | Status: AC
  Administered 2022-01-31: 1 g via INTRAVENOUS
  Filled 2022-01-31: qty 10

## 2022-01-31 MED ORDER — PANTOPRAZOLE SODIUM 40 MG PO TBEC
40.0000 mg | DELAYED_RELEASE_TABLET | Freq: Every day | ORAL | Status: DC
  Administered 2022-01-31 – 2022-02-01 (×2): 40 mg via ORAL
  Filled 2022-01-31 (×2): qty 1

## 2022-01-31 MED ORDER — ARFORMOTEROL TARTRATE 15 MCG/2ML IN NEBU
15.0000 ug | INHALATION_SOLUTION | Freq: Two times a day (BID) | RESPIRATORY_TRACT | Status: DC
  Administered 2022-01-31 – 2022-02-01 (×3): 15 ug via RESPIRATORY_TRACT
  Filled 2022-01-31 (×2): qty 2

## 2022-01-31 MED ORDER — THIAMINE HCL 100 MG/ML IJ SOLN
250.0000 mg | Freq: Two times a day (BID) | INTRAVENOUS | Status: AC
  Administered 2022-01-31: 250 mg via INTRAVENOUS
  Filled 2022-01-31: qty 2.5

## 2022-01-31 MED ORDER — THIAMINE HCL 100 MG PO TABS
250.0000 mg | ORAL_TABLET | Freq: Every day | ORAL | Status: DC
  Administered 2022-02-01: 250 mg via ORAL
  Filled 2022-01-31: qty 3

## 2022-01-31 MED ORDER — DEXTROSE 5 % IV SOLN
30.0000 mmol | Freq: Once | INTRAVENOUS | Status: AC
  Administered 2022-01-31: 30 mmol via INTRAVENOUS
  Filled 2022-01-31: qty 10

## 2022-01-31 MED ORDER — REVEFENACIN 175 MCG/3ML IN SOLN
175.0000 ug | Freq: Every day | RESPIRATORY_TRACT | Status: DC
  Administered 2022-01-31 – 2022-02-01 (×2): 175 ug via RESPIRATORY_TRACT
  Filled 2022-01-31: qty 3

## 2022-01-31 MED ORDER — PREDNISONE 20 MG PO TABS
40.0000 mg | ORAL_TABLET | Freq: Every day | ORAL | Status: DC
  Administered 2022-01-31 – 2022-02-01 (×2): 40 mg via ORAL
  Filled 2022-01-31 (×2): qty 2

## 2022-01-31 MED ORDER — THIAMINE HCL 100 MG/ML IJ SOLN
INTRAMUSCULAR | Status: AC
  Filled 2022-01-31: qty 4

## 2022-01-31 MED ORDER — GUAIFENESIN ER 600 MG PO TB12
600.0000 mg | ORAL_TABLET | Freq: Two times a day (BID) | ORAL | Status: DC
  Administered 2022-01-31 – 2022-02-01 (×3): 600 mg via ORAL
  Filled 2022-01-31 (×3): qty 1

## 2022-01-31 NOTE — Progress Notes (Signed)
Patient has order to remove dialysis catheter, made Charge nurse Fleet Contras RN and Jorja Loa [facility] aware, per [facility] unable to remove today due to nobody available to remove dialysis catheter today. MD Vallery Sa made aware. Will report to on-coming nurse.

## 2022-02-01 ENCOUNTER — Inpatient Hospital Stay (HOSPITAL_COMMUNITY): Payer: Medicare Other

## 2022-02-01 DIAGNOSIS — T39094D Poisoning by salicylates, undetermined, subsequent encounter: Secondary | ICD-10-CM

## 2022-02-01 DIAGNOSIS — L309 Dermatitis, unspecified: Secondary | ICD-10-CM | POA: Diagnosis not present

## 2022-02-01 DIAGNOSIS — F10931 Alcohol use, unspecified with withdrawal delirium: Secondary | ICD-10-CM | POA: Diagnosis not present

## 2022-02-01 DIAGNOSIS — S22080D Wedge compression fracture of T11-T12 vertebra, subsequent encounter for fracture with routine healing: Secondary | ICD-10-CM

## 2022-02-01 DIAGNOSIS — E785 Hyperlipidemia, unspecified: Secondary | ICD-10-CM | POA: Diagnosis not present

## 2022-02-01 LAB — BASIC METABOLIC PANEL
Anion gap: 7 (ref 5–15)
BUN: 8 mg/dL (ref 8–23)
CO2: 29 mmol/L (ref 22–32)
Calcium: 8.3 mg/dL — ABNORMAL LOW (ref 8.9–10.3)
Chloride: 98 mmol/L (ref 98–111)
Creatinine, Ser: 0.6 mg/dL — ABNORMAL LOW (ref 0.61–1.24)
GFR, Estimated: >60 mL/min (ref >60)
Glucose, Bld: 84 mg/dL (ref 70–99)
Potassium: 3.5 mmol/L (ref 3.5–5.1)
Sodium: 134 mmol/L — ABNORMAL LOW (ref 135–145)

## 2022-02-01 LAB — CULTURE, BLOOD (ROUTINE X 2)
Culture: NO GROWTH
Special Requests: ADEQUATE
Special Requests: ADEQUATE

## 2022-02-01 LAB — MAGNESIUM: Magnesium: 2.3 mg/dL (ref 1.7–2.4)

## 2022-02-01 LAB — CBC
HCT: 34 % — ABNORMAL LOW (ref 39.0–52.0)
Hemoglobin: 11.8 g/dL — ABNORMAL LOW (ref 13.0–17.0)
MCH: 36.2 pg — ABNORMAL HIGH (ref 26.0–34.0)
MCHC: 34.7 g/dL (ref 30.0–36.0)
MCV: 104.3 fL — ABNORMAL HIGH (ref 80.0–100.0)
Platelets: 204 10*3/uL (ref 150–400)
RBC: 3.26 MIL/uL — ABNORMAL LOW (ref 4.22–5.81)
RDW: 14 % (ref 11.5–15.5)
WBC: 10.4 10*3/uL (ref 4.0–10.5)
nRBC: 0 % (ref 0.0–0.2)

## 2022-02-01 LAB — GLUCOSE, CAPILLARY: Glucose-Capillary: 121 mg/dL — ABNORMAL HIGH (ref 70–99)

## 2022-02-01 MED ORDER — FLUTICASONE-SALMETEROL 250-50 MCG/ACT IN AEPB: 1.0000 | INHALATION_SPRAY | Freq: Two times a day (BID) | RESPIRATORY_TRACT | 2 refills | Status: DC

## 2022-02-01 MED ORDER — ACETAMINOPHEN 500 MG PO TABS: 750.0000 mg | ORAL_TABLET | Freq: Four times a day (QID) | ORAL | Status: DC | PRN

## 2022-02-01 MED ORDER — ALBUTEROL SULFATE HFA 108 (90 BASE) MCG/ACT IN AERS: 2.0000 | INHALATION_SPRAY | Freq: Four times a day (QID) | RESPIRATORY_TRACT | 2 refills | Status: DC | PRN

## 2022-02-01 MED ORDER — NYSTATIN 100000 UNIT/GM EX CREA: TOPICAL_CREAM | Freq: Two times a day (BID) | CUTANEOUS | 0 refills | Status: DC

## 2022-02-01 MED ORDER — GUAIFENESIN ER 600 MG PO TB12: 600.0000 mg | ORAL_TABLET | Freq: Two times a day (BID) | ORAL | 0 refills | Status: DC

## 2022-02-01 MED ORDER — CHLORDIAZEPOXIDE HCL 10 MG PO CAPS: ORAL_CAPSULE | ORAL | 0 refills | Status: DC

## 2022-02-01 MED ORDER — PANTOPRAZOLE SODIUM 40 MG PO TBEC: 40.0000 mg | DELAYED_RELEASE_TABLET | Freq: Every day | ORAL | 1 refills | Status: DC

## 2022-02-01 MED ORDER — PREDNISONE 5 MG PO TABS
5.0000 mg | ORAL_TABLET | Freq: Every day | ORAL | 0 refills | Status: DC
Start: 2022-02-01 — End: 2023-06-29

## 2022-02-01 MED ORDER — THIAMINE HCL 100 MG PO TABS: 250.0000 mg | ORAL_TABLET | Freq: Every day | ORAL | 2 refills | Status: DC

## 2022-02-01 NOTE — Progress Notes (Signed)
Nurse held pressure for 20 minutes without complications dressing is clean and dry.

## 2022-02-01 NOTE — Evaluation (Signed)
Physical Therapy Evaluation Patient Details Name: Vincent Gaines MRN: [id number] DOB: Dec 11, 1945 Today's Date: 02/01/2022  History of Present Illness  76 year old with history of eczema and alcohol abuse presents from home for increasing work of breathing, shortness of breath and altered mental status.  Patient has been drinking heavily for the past few days prior to admission.  Upon admission noted to be tachycardic and tachypneic.  He was also confused.  Upon admission CT head was negative, chest x-ray showed mild vascular congestion, CT chest showed bilateral groundglass opacities.  His salicylate levels were elevated requiring urgent hemodialysis.  Nephrology team was consulted.  Bicarb infusion was eventually turned off as salicylate levels improved.  Had abnormal breath sounds therefore started on bronchodilators.   Clinical Impression  Patient supine in bed upon therapist arrival. Patient agreeable to participating in PT evaluation today. Patient reports progressively worsening left eye macular degeneration and progressively decreasing right eye vision acuity relying on light and touch often times to find what he needs at home. Patient also reports 2 falls in the last six months. Patient performed bed mobility and transfers modified independent. Patient ambulated with a somewhat slow cadence, short equal length steps and was somewhat unsteady without assistive device use. No gross losses of balance were noted but patient did walk close to hall walls and reach for railing a few times during ambulation. Patient limited by fatigue and demonstrated mild dyspnea on exertion. Patient was on room air throughout session.  Patient would continue to benefit from skilled physical therapy in current environment and next venue to continue return to prior function and increase strength, endurance, balance, coordination, and functional mobility and gait skills.       Recommendations for follow up therapy  are one component of a multi-disciplinary discharge planning process, led by the attending physician.  Recommendations may be updated based on patient status, additional functional criteria and insurance authorization.  Follow Up Recommendations Home health PT      Assistance Recommended at Discharge Set up Supervision/Assistance  Patient can return home with the following  A little help with walking and/or transfers;A little help with bathing/dressing/bathroom;Assist for transportation;Help with stairs or ramp for entrance;Assistance with cooking/housework    Equipment Recommendations None recommended by PT  Recommendations for Other Services       Functional Status Assessment Patient has had a recent decline in their functional status and demonstrates the ability to make significant improvements in function in a reasonable and predictable amount of time.     Precautions / Restrictions Precautions Precautions: Fall Precaution Comments: patient reports 2 falls in the last six months Restrictions Weight Bearing Restrictions: No Other Position/Activity Restrictions: Patient reports progressively worsening left eye macular degeneration and progressively decreasing right eye vision acuity relying on light and touch often times to find what he needs at home.      Mobility  Bed Mobility Overal bed mobility: Modified Independent    Transfers Overall transfer level: Modified independent Equipment used: None      Ambulation/Gait Ambulation/Gait assistance: Min guard, Supervision Gait Distance (Feet): 100 Feet Assistive device: None Gait Pattern/deviations: Step-through pattern, Decreased step length - right, Decreased step length - left, Decreased stride length, Wide base of support Gait velocity: decreased     General Gait Details: somewhat slow, short equal length steps with a somewhat unsteady cadence without assistive device; no gross  losses of balance; limited by fatigue;  on room air  Stairs      Wheelchair Mobility  Modified Rankin (Stroke Patients Only)       Balance Overall balance assessment: History of Falls, Mild deficits observed, not formally tested         Pertinent Vitals/Pain Pain Assessment Pain Assessment: 0-10 Pain Score: 0-No pain    Home Living Family/patient expects to be discharged to:: Private residence Living Arrangements: Alone Available Help at Discharge: Available PRN/intermittently Type of Home: House Home Access: Stairs to enter Entrance Stairs-Rails: Right;Left;Can reach both Entrance Stairs-Number of Steps: 3   Home Layout: One level Home Equipment: Cane - Programmer, applications (2 wheels)      Prior Function Prior Level of Function : Driving;Needs assist       Physical Assist : ADLs (physical)     Mobility Comments: limited community ambulator ADLs Comments: sometimes gets help for cooking, cleaning and laundry; daughter does grocery shopping     Hand Dominance        Extremity/Trunk Assessment   Upper Extremity Assessment Upper Extremity Assessment: Overall WFL for tasks assessed    Lower Extremity Assessment Lower Extremity Assessment: Overall WFL for tasks assessed    Cervical / Trunk Assessment Cervical / Trunk Assessment: Normal  Communication   Communication: No difficulties  Cognition Arousal/Alertness: Awake/alert Behavior During Therapy: WFL for tasks assessed/performed Overall Cognitive Status: Within Functional Limits for tasks assessed          General Comments      Exercises     Assessment/Plan    PT Assessment Patient needs continued PT services  PT Problem List Decreased activity tolerance;Decreased balance;Decreased mobility       PT Treatment Interventions DME instruction;Therapeutic exercise;Gait training;Balance training;Manual techniques;Stair training;Neuromuscular re-education;Functional mobility training;Cognitive remediation;Therapeutic  activities;Patient/family education    PT Goals (Current goals can be found in the Care Plan section)  Acute Rehab PT Goals Patient Stated Goal: Go home and build stamina back up. PT Goal Formulation: With patient Time For Goal Achievement: 02/15/22 Potential to Achieve Goals: Good    Frequency Min 3X/week        AM-PAC PT "6 Clicks" Mobility  Outcome Measure Help needed turning from your back to your side while in a flat bed without using bedrails?: None Help needed moving from lying on your back to sitting on the side of a flat bed without using bedrails?: None Help needed moving to and from a bed to a chair (including a wheelchair)?: A Little Help needed standing up from a chair using your arms (e.g., wheelchair or bedside chair)?: None Help needed to walk in hospital room?: A Little Help needed climbing 3-5 steps with a railing? : A Little 6 Click Score: 21    End of Session Equipment Utilized During Treatment: Gait belt Activity Tolerance: Patient tolerated treatment well;Patient limited by fatigue Patient left: in bed;with bed alarm set;with call bell/phone within reach Nurse Communication: Mobility status PT Visit Diagnosis: Unsteadiness on feet (R26.81);History of falling (Z91.81);Other abnormalities of gait and mobility (R26.89)    Time: 1130-1155 PT Time Calculation (min) (ACUTE ONLY): 25 min   Charges:   PT Evaluation $PT Eval Low Complexity: 1 Low PT Treatments $Self Care/Home Management: 8-22        Katina Dung. Hartnett-Rands, MS, PT [place] [facility] [place] [phone]   Britta Mccreedy  Hartnett-Rands 02/01/2022, 12:11 PM

## 2022-02-02 LAB — CULTURE, BLOOD (ROUTINE X 2): Culture: NO GROWTH

## 2022-02-03 DIAGNOSIS — G928 Other toxic encephalopathy: Secondary | ICD-10-CM | POA: Diagnosis not present

## 2022-02-03 DIAGNOSIS — E873 Alkalosis: Secondary | ICD-10-CM | POA: Diagnosis not present

## 2022-02-03 DIAGNOSIS — T39091D Poisoning by salicylates, accidental (unintentional), subsequent encounter: Secondary | ICD-10-CM | POA: Diagnosis not present

## 2022-02-03 DIAGNOSIS — E872 Acidosis, unspecified: Secondary | ICD-10-CM | POA: Diagnosis not present

## 2022-02-03 DIAGNOSIS — M1711 Unilateral primary osteoarthritis, right knee: Secondary | ICD-10-CM | POA: Diagnosis not present

## 2022-02-03 DIAGNOSIS — J449 Chronic obstructive pulmonary disease, unspecified: Secondary | ICD-10-CM | POA: Diagnosis not present

## 2022-02-03 DIAGNOSIS — L309 Dermatitis, unspecified: Secondary | ICD-10-CM | POA: Diagnosis not present

## 2022-02-05 DIAGNOSIS — G928 Other toxic encephalopathy: Secondary | ICD-10-CM | POA: Diagnosis not present

## 2022-02-05 DIAGNOSIS — E872 Acidosis, unspecified: Secondary | ICD-10-CM | POA: Diagnosis not present

## 2022-02-05 DIAGNOSIS — T39091D Poisoning by salicylates, accidental (unintentional), subsequent encounter: Secondary | ICD-10-CM | POA: Diagnosis not present

## 2022-02-05 DIAGNOSIS — L309 Dermatitis, unspecified: Secondary | ICD-10-CM | POA: Diagnosis not present

## 2022-02-05 DIAGNOSIS — E873 Alkalosis: Secondary | ICD-10-CM | POA: Diagnosis not present

## 2022-02-08 DIAGNOSIS — G928 Other toxic encephalopathy: Secondary | ICD-10-CM | POA: Diagnosis not present

## 2022-02-08 DIAGNOSIS — E873 Alkalosis: Secondary | ICD-10-CM | POA: Diagnosis not present

## 2022-02-08 DIAGNOSIS — E872 Acidosis, unspecified: Secondary | ICD-10-CM | POA: Diagnosis not present

## 2022-02-08 DIAGNOSIS — L309 Dermatitis, unspecified: Secondary | ICD-10-CM | POA: Diagnosis not present

## 2022-02-08 DIAGNOSIS — T39091D Poisoning by salicylates, accidental (unintentional), subsequent encounter: Secondary | ICD-10-CM | POA: Diagnosis not present

## 2022-02-10 DIAGNOSIS — F1020 Alcohol dependence, uncomplicated: Secondary | ICD-10-CM

## 2022-02-10 DIAGNOSIS — L209 Atopic dermatitis, unspecified: Secondary | ICD-10-CM | POA: Diagnosis not present

## 2022-02-10 DIAGNOSIS — R600 Localized edema: Secondary | ICD-10-CM | POA: Diagnosis not present

## 2022-02-10 DIAGNOSIS — J449 Chronic obstructive pulmonary disease, unspecified: Secondary | ICD-10-CM | POA: Diagnosis not present

## 2022-02-10 DIAGNOSIS — N390 Urinary tract infection, site not specified: Secondary | ICD-10-CM | POA: Diagnosis not present

## 2022-02-10 DIAGNOSIS — T39091D Poisoning by salicylates, accidental (unintentional), subsequent encounter: Secondary | ICD-10-CM | POA: Diagnosis not present

## 2022-02-10 DIAGNOSIS — R4182 Altered mental status, unspecified: Secondary | ICD-10-CM

## 2022-02-10 HISTORY — DX: Altered mental status, unspecified: R41.82

## 2022-02-10 HISTORY — DX: Alcohol dependence, uncomplicated: F10.20

## 2022-02-11 DIAGNOSIS — E873 Alkalosis: Secondary | ICD-10-CM | POA: Diagnosis not present

## 2022-02-11 DIAGNOSIS — G928 Other toxic encephalopathy: Secondary | ICD-10-CM | POA: Diagnosis not present

## 2022-02-11 DIAGNOSIS — L309 Dermatitis, unspecified: Secondary | ICD-10-CM | POA: Diagnosis not present

## 2022-02-11 DIAGNOSIS — E872 Acidosis, unspecified: Secondary | ICD-10-CM | POA: Diagnosis not present

## 2022-02-11 DIAGNOSIS — T39091D Poisoning by salicylates, accidental (unintentional), subsequent encounter: Secondary | ICD-10-CM | POA: Diagnosis not present

## 2022-02-12 DIAGNOSIS — E872 Acidosis, unspecified: Secondary | ICD-10-CM | POA: Diagnosis not present

## 2022-02-12 DIAGNOSIS — G928 Other toxic encephalopathy: Secondary | ICD-10-CM | POA: Diagnosis not present

## 2022-02-12 DIAGNOSIS — E873 Alkalosis: Secondary | ICD-10-CM | POA: Diagnosis not present

## 2022-02-12 DIAGNOSIS — T39091D Poisoning by salicylates, accidental (unintentional), subsequent encounter: Secondary | ICD-10-CM | POA: Diagnosis not present

## 2022-02-12 DIAGNOSIS — L309 Dermatitis, unspecified: Secondary | ICD-10-CM | POA: Diagnosis not present

## 2022-02-16 DIAGNOSIS — E873 Alkalosis: Secondary | ICD-10-CM | POA: Diagnosis not present

## 2022-02-16 DIAGNOSIS — L309 Dermatitis, unspecified: Secondary | ICD-10-CM | POA: Diagnosis not present

## 2022-02-16 DIAGNOSIS — E872 Acidosis, unspecified: Secondary | ICD-10-CM | POA: Diagnosis not present

## 2022-02-16 DIAGNOSIS — T39091D Poisoning by salicylates, accidental (unintentional), subsequent encounter: Secondary | ICD-10-CM | POA: Diagnosis not present

## 2022-02-16 DIAGNOSIS — G928 Other toxic encephalopathy: Secondary | ICD-10-CM | POA: Diagnosis not present

## 2022-02-17 DIAGNOSIS — L309 Dermatitis, unspecified: Secondary | ICD-10-CM | POA: Diagnosis not present

## 2022-02-17 DIAGNOSIS — E873 Alkalosis: Secondary | ICD-10-CM | POA: Diagnosis not present

## 2022-02-17 DIAGNOSIS — T39091D Poisoning by salicylates, accidental (unintentional), subsequent encounter: Secondary | ICD-10-CM | POA: Diagnosis not present

## 2022-02-17 DIAGNOSIS — G928 Other toxic encephalopathy: Secondary | ICD-10-CM | POA: Diagnosis not present

## 2022-02-17 DIAGNOSIS — E872 Acidosis, unspecified: Secondary | ICD-10-CM | POA: Diagnosis not present

## 2022-02-22 DIAGNOSIS — E873 Alkalosis: Secondary | ICD-10-CM | POA: Diagnosis not present

## 2022-02-22 DIAGNOSIS — G928 Other toxic encephalopathy: Secondary | ICD-10-CM | POA: Diagnosis not present

## 2022-02-22 DIAGNOSIS — T39091D Poisoning by salicylates, accidental (unintentional), subsequent encounter: Secondary | ICD-10-CM | POA: Diagnosis not present

## 2022-02-22 DIAGNOSIS — E872 Acidosis, unspecified: Secondary | ICD-10-CM | POA: Diagnosis not present

## 2022-02-22 DIAGNOSIS — L309 Dermatitis, unspecified: Secondary | ICD-10-CM | POA: Diagnosis not present

## 2022-03-22 DIAGNOSIS — L308 Other specified dermatitis: Secondary | ICD-10-CM | POA: Diagnosis not present

## 2022-03-22 DIAGNOSIS — L298 Other pruritus: Secondary | ICD-10-CM | POA: Diagnosis not present

## 2022-03-24 DIAGNOSIS — H35371 Puckering of macula, right eye: Secondary | ICD-10-CM | POA: Diagnosis not present

## 2022-03-24 DIAGNOSIS — H353221 Exudative age-related macular degeneration, left eye, with active choroidal neovascularization: Secondary | ICD-10-CM | POA: Diagnosis not present

## 2022-03-24 DIAGNOSIS — D3131 Benign neoplasm of right choroid: Secondary | ICD-10-CM | POA: Diagnosis not present

## 2022-03-24 DIAGNOSIS — H353113 Nonexudative age-related macular degeneration, right eye, advanced atrophic without subfoveal involvement: Secondary | ICD-10-CM | POA: Diagnosis not present

## 2022-03-25 DIAGNOSIS — E782 Mixed hyperlipidemia: Secondary | ICD-10-CM | POA: Diagnosis not present

## 2022-03-25 DIAGNOSIS — L408 Other psoriasis: Secondary | ICD-10-CM | POA: Diagnosis not present

## 2022-05-13 DIAGNOSIS — Z0000 Encounter for general adult medical examination without abnormal findings: Secondary | ICD-10-CM | POA: Diagnosis not present

## 2022-05-13 DIAGNOSIS — Z136 Encounter for screening for cardiovascular disorders: Secondary | ICD-10-CM | POA: Diagnosis not present

## 2022-05-20 DIAGNOSIS — H6121 Impacted cerumen, right ear: Secondary | ICD-10-CM | POA: Diagnosis not present

## 2022-05-20 DIAGNOSIS — H3530 Unspecified macular degeneration: Secondary | ICD-10-CM | POA: Diagnosis not present

## 2022-05-20 DIAGNOSIS — L309 Dermatitis, unspecified: Secondary | ICD-10-CM | POA: Diagnosis not present

## 2022-05-20 DIAGNOSIS — E782 Mixed hyperlipidemia: Secondary | ICD-10-CM | POA: Diagnosis not present

## 2022-05-20 DIAGNOSIS — Z0000 Encounter for general adult medical examination without abnormal findings: Secondary | ICD-10-CM | POA: Diagnosis not present

## 2022-05-20 DIAGNOSIS — R634 Abnormal weight loss: Secondary | ICD-10-CM | POA: Diagnosis not present

## 2022-05-20 DIAGNOSIS — I10 Essential (primary) hypertension: Secondary | ICD-10-CM | POA: Diagnosis not present

## 2022-05-20 DIAGNOSIS — F17200 Nicotine dependence, unspecified, uncomplicated: Secondary | ICD-10-CM | POA: Diagnosis not present

## 2022-05-20 DIAGNOSIS — G4709 Other insomnia: Secondary | ICD-10-CM

## 2022-05-20 DIAGNOSIS — M5442 Lumbago with sciatica, left side: Secondary | ICD-10-CM | POA: Diagnosis not present

## 2022-05-20 HISTORY — DX: Other insomnia: G47.09

## 2022-11-01 DIAGNOSIS — H353113 Nonexudative age-related macular degeneration, right eye, advanced atrophic without subfoveal involvement: Secondary | ICD-10-CM | POA: Diagnosis not present

## 2022-11-01 DIAGNOSIS — D3131 Benign neoplasm of right choroid: Secondary | ICD-10-CM | POA: Diagnosis not present

## 2022-11-01 DIAGNOSIS — H35371 Puckering of macula, right eye: Secondary | ICD-10-CM | POA: Diagnosis not present

## 2022-11-01 DIAGNOSIS — H353222 Exudative age-related macular degeneration, left eye, with inactive choroidal neovascularization: Secondary | ICD-10-CM | POA: Diagnosis not present

## 2022-12-14 DIAGNOSIS — F17210 Nicotine dependence, cigarettes, uncomplicated: Secondary | ICD-10-CM | POA: Diagnosis not present

## 2022-12-14 DIAGNOSIS — G4700 Insomnia, unspecified: Secondary | ICD-10-CM | POA: Diagnosis not present

## 2022-12-14 DIAGNOSIS — I10 Essential (primary) hypertension: Secondary | ICD-10-CM | POA: Diagnosis not present

## 2022-12-14 DIAGNOSIS — F17200 Nicotine dependence, unspecified, uncomplicated: Secondary | ICD-10-CM | POA: Diagnosis not present

## 2023-02-01 DIAGNOSIS — H353222 Exudative age-related macular degeneration, left eye, with inactive choroidal neovascularization: Secondary | ICD-10-CM | POA: Diagnosis not present

## 2023-02-01 DIAGNOSIS — D3131 Benign neoplasm of right choroid: Secondary | ICD-10-CM | POA: Diagnosis not present

## 2023-02-01 DIAGNOSIS — H35371 Puckering of macula, right eye: Secondary | ICD-10-CM | POA: Diagnosis not present

## 2023-02-01 DIAGNOSIS — H353113 Nonexudative age-related macular degeneration, right eye, advanced atrophic without subfoveal involvement: Secondary | ICD-10-CM | POA: Diagnosis not present

## 2023-05-13 DIAGNOSIS — I10 Essential (primary) hypertension: Secondary | ICD-10-CM | POA: Diagnosis not present

## 2023-05-19 DIAGNOSIS — Z0001 Encounter for general adult medical examination with abnormal findings: Secondary | ICD-10-CM | POA: Diagnosis not present

## 2023-05-19 DIAGNOSIS — I10 Essential (primary) hypertension: Secondary | ICD-10-CM | POA: Diagnosis not present

## 2023-05-19 DIAGNOSIS — F17200 Nicotine dependence, unspecified, uncomplicated: Secondary | ICD-10-CM | POA: Diagnosis not present

## 2023-05-19 DIAGNOSIS — E782 Mixed hyperlipidemia: Secondary | ICD-10-CM | POA: Diagnosis not present

## 2023-05-19 DIAGNOSIS — H3530 Unspecified macular degeneration: Secondary | ICD-10-CM | POA: Diagnosis not present

## 2023-05-19 DIAGNOSIS — D649 Anemia, unspecified: Secondary | ICD-10-CM

## 2023-05-19 HISTORY — DX: Anemia, unspecified: D64.9

## 2023-06-20 DIAGNOSIS — H1849 Other corneal degeneration: Secondary | ICD-10-CM | POA: Diagnosis not present

## 2023-06-21 DIAGNOSIS — I10 Essential (primary) hypertension: Secondary | ICD-10-CM | POA: Diagnosis not present

## 2023-06-21 DIAGNOSIS — F17200 Nicotine dependence, unspecified, uncomplicated: Secondary | ICD-10-CM | POA: Diagnosis not present

## 2023-06-21 DIAGNOSIS — E871 Hypo-osmolality and hyponatremia: Secondary | ICD-10-CM | POA: Diagnosis not present

## 2023-06-21 DIAGNOSIS — F17210 Nicotine dependence, cigarettes, uncomplicated: Secondary | ICD-10-CM | POA: Diagnosis not present

## 2023-06-21 DIAGNOSIS — J302 Other seasonal allergic rhinitis: Secondary | ICD-10-CM | POA: Diagnosis not present

## 2023-06-21 DIAGNOSIS — D649 Anemia, unspecified: Secondary | ICD-10-CM | POA: Diagnosis not present

## 2023-06-21 DIAGNOSIS — Z713 Dietary counseling and surveillance: Secondary | ICD-10-CM | POA: Diagnosis not present

## 2023-06-21 DIAGNOSIS — Z79899 Other long term (current) drug therapy: Secondary | ICD-10-CM | POA: Diagnosis not present

## 2023-06-22 ENCOUNTER — Other Ambulatory Visit (HOSPITAL_COMMUNITY): Payer: Self-pay | Admitting: Internal Medicine

## 2023-06-22 DIAGNOSIS — F17200 Nicotine dependence, unspecified, uncomplicated: Secondary | ICD-10-CM

## 2023-06-29 ENCOUNTER — Observation Stay (HOSPITAL_COMMUNITY)
Admission: EM | Admit: 2023-06-29 | Discharge: 2023-06-30 | Disposition: A | Discharge status: Home / Self Care | Payer: Medicare Other | Attending: Family Medicine | Admitting: Family Medicine

## 2023-06-29 ENCOUNTER — Other Ambulatory Visit: Payer: Self-pay

## 2023-06-29 ENCOUNTER — Emergency Department (HOSPITAL_COMMUNITY): Payer: Medicare Other

## 2023-06-29 ENCOUNTER — Encounter (HOSPITAL_COMMUNITY): Payer: Self-pay

## 2023-06-29 DIAGNOSIS — Z79899 Other long term (current) drug therapy: Secondary | ICD-10-CM | POA: Insufficient documentation

## 2023-06-29 DIAGNOSIS — I499 Cardiac arrhythmia, unspecified: Secondary | ICD-10-CM | POA: Diagnosis not present

## 2023-06-29 DIAGNOSIS — J449 Chronic obstructive pulmonary disease, unspecified: Secondary | ICD-10-CM | POA: Diagnosis present

## 2023-06-29 DIAGNOSIS — R069 Unspecified abnormalities of breathing: Secondary | ICD-10-CM | POA: Diagnosis not present

## 2023-06-29 DIAGNOSIS — I700 Atherosclerosis of aorta: Secondary | ICD-10-CM | POA: Diagnosis not present

## 2023-06-29 DIAGNOSIS — E871 Hypo-osmolality and hyponatremia: Secondary | ICD-10-CM | POA: Diagnosis present

## 2023-06-29 DIAGNOSIS — F419 Anxiety disorder, unspecified: Secondary | ICD-10-CM | POA: Diagnosis not present

## 2023-06-29 DIAGNOSIS — Z87891 Personal history of nicotine dependence: Secondary | ICD-10-CM | POA: Diagnosis not present

## 2023-06-29 DIAGNOSIS — R9389 Abnormal findings on diagnostic imaging of other specified body structures: Secondary | ICD-10-CM | POA: Diagnosis not present

## 2023-06-29 DIAGNOSIS — J441 Chronic obstructive pulmonary disease with (acute) exacerbation: Secondary | ICD-10-CM

## 2023-06-29 DIAGNOSIS — E440 Moderate protein-calorie malnutrition: Secondary | ICD-10-CM | POA: Insufficient documentation

## 2023-06-29 DIAGNOSIS — R0602 Shortness of breath: Secondary | ICD-10-CM | POA: Diagnosis not present

## 2023-06-29 DIAGNOSIS — E785 Hyperlipidemia, unspecified: Secondary | ICD-10-CM | POA: Diagnosis not present

## 2023-06-29 DIAGNOSIS — R079 Chest pain, unspecified: Secondary | ICD-10-CM | POA: Diagnosis not present

## 2023-06-29 DIAGNOSIS — Z743 Need for continuous supervision: Secondary | ICD-10-CM | POA: Diagnosis not present

## 2023-06-29 HISTORY — DX: Chronic obstructive pulmonary disease with (acute) exacerbation: J44.1

## 2023-06-29 LAB — CBC
HCT: 34.8 % — ABNORMAL LOW (ref 39.0–52.0)
Hemoglobin: 11.9 g/dL — ABNORMAL LOW (ref 13.0–17.0)
MCH: 33.9 pg (ref 26.0–34.0)
MCHC: 34.2 g/dL (ref 30.0–36.0)
MCV: 99.1 fL (ref 80.0–100.0)
Platelets: 281 10*3/uL (ref 150–400)
RBC: 3.51 MIL/uL — ABNORMAL LOW (ref 4.22–5.81)
RDW: 12.9 % (ref 11.5–15.5)
WBC: 8.6 10*3/uL (ref 4.0–10.5)
nRBC: 0 % (ref 0.0–0.2)

## 2023-06-29 LAB — BASIC METABOLIC PANEL
Anion gap: 8 (ref 5–15)
BUN: 8 mg/dL (ref 8–23)
CO2: 29 mmol/L (ref 22–32)
Calcium: 8.7 mg/dL — ABNORMAL LOW (ref 8.9–10.3)
Chloride: 90 mmol/L — ABNORMAL LOW (ref 98–111)
Creatinine, Ser: 0.74 mg/dL (ref 0.61–1.24)
GFR, Estimated: >60 mL/min (ref >60)
Glucose, Bld: 113 mg/dL — ABNORMAL HIGH (ref 70–99)
Potassium: 3.8 mmol/L (ref 3.5–5.1)
Sodium: 127 mmol/L — ABNORMAL LOW (ref 135–145)

## 2023-06-29 MED ORDER — ALBUTEROL SULFATE (2.5 MG/3ML) 0.083% IN NEBU: 2.5000 mg | INHALATION_SOLUTION | RESPIRATORY_TRACT | Status: DC | PRN

## 2023-06-29 MED ORDER — ENOXAPARIN SODIUM 40 MG/0.4ML IJ SOSY
40.0000 mg | PREFILLED_SYRINGE | INTRAMUSCULAR | Status: DC
Start: 2023-06-29 — End: 2023-06-30
  Administered 2023-06-29: 40 mg via SUBCUTANEOUS
  Filled 2023-06-29: qty 0.4

## 2023-06-29 MED ORDER — METHYLPREDNISOLONE SODIUM SUCC 125 MG IJ SOLR
80.0000 mg | Freq: Two times a day (BID) | INTRAMUSCULAR | Status: DC
  Administered 2023-06-29 – 2023-06-30 (×3): 80 mg via INTRAVENOUS
  Filled 2023-06-29 (×3): qty 2

## 2023-06-29 MED ORDER — MOMETASONE FURO-FORMOTEROL FUM 200-5 MCG/ACT IN AERO
2.0000 | INHALATION_SPRAY | Freq: Two times a day (BID) | RESPIRATORY_TRACT | Status: DC
  Administered 2023-06-29 – 2023-06-30 (×2): 2 via RESPIRATORY_TRACT
  Filled 2023-06-29: qty 8.8

## 2023-06-29 MED ORDER — ALPRAZOLAM 0.5 MG PO TABS
0.5000 mg | ORAL_TABLET | Freq: Once | ORAL | Status: AC
  Administered 2023-06-29: 0.5 mg via ORAL
  Filled 2023-06-29: qty 1

## 2023-06-29 MED ORDER — IPRATROPIUM-ALBUTEROL 0.5-2.5 (3) MG/3ML IN SOLN
3.0000 mL | Freq: Two times a day (BID) | RESPIRATORY_TRACT | Status: DC
  Administered 2023-06-29 – 2023-06-30 (×2): 3 mL via RESPIRATORY_TRACT
  Filled 2023-06-29 (×3): qty 3

## 2023-06-29 MED ORDER — PREDNISONE 20 MG PO TABS: 40.0000 mg | ORAL_TABLET | Freq: Every day | ORAL | Status: DC

## 2023-06-29 MED ORDER — IPRATROPIUM-ALBUTEROL 0.5-2.5 (3) MG/3ML IN SOLN
3.0000 mL | Freq: Once | RESPIRATORY_TRACT | Status: AC
  Administered 2023-06-29: 3 mL via RESPIRATORY_TRACT
  Filled 2023-06-29: qty 3

## 2023-06-29 MED ORDER — AZITHROMYCIN 250 MG PO TABS
500.0000 mg | ORAL_TABLET | Freq: Once | ORAL | Status: AC
  Administered 2023-06-29: 500 mg via ORAL
  Filled 2023-06-29: qty 2

## 2023-06-29 MED ORDER — AZITHROMYCIN 250 MG PO TABS
250.0000 mg | ORAL_TABLET | Freq: Every day | ORAL | Status: DC
  Administered 2023-06-30: 250 mg via ORAL
  Filled 2023-06-29: qty 1

## 2023-06-29 MED ORDER — IPRATROPIUM-ALBUTEROL 0.5-2.5 (3) MG/3ML IN SOLN
3.0000 mL | Freq: Three times a day (TID) | RESPIRATORY_TRACT | Status: DC
  Administered 2023-06-29: 3 mL via RESPIRATORY_TRACT
  Filled 2023-06-29: qty 3

## 2023-06-29 NOTE — ED Notes (Addendum)
Pt was 90% while ambulating in room; pt started wheezing and coughing 1 minute into walk.

## 2023-06-29 NOTE — ED Notes (Signed)
RT in room.

## 2023-06-29 NOTE — ED Triage Notes (Signed)
Rcems from home cc of SOB that started "a while ago" but tonight decided to get checked out.  Rec'd neb en route. Albuterol atrovent 125mg  solumedrol.  Ems stated it started last week and seen pcp for it - was on albuterol inhaler. Dx with URI. 18g r ac   Initially 85% on room air with wheezing.

## 2023-06-29 NOTE — ED Notes (Signed)
Lab and XR at bedside
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
## 2023-06-29 NOTE — ED Notes (Signed)
ED Provider at bedside. 
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
## 2023-06-29 NOTE — Plan of Care (Signed)
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
## 2023-06-30 DIAGNOSIS — J441 Chronic obstructive pulmonary disease with (acute) exacerbation: Secondary | ICD-10-CM | POA: Diagnosis not present

## 2023-06-30 DIAGNOSIS — E440 Moderate protein-calorie malnutrition: Secondary | ICD-10-CM

## 2023-06-30 HISTORY — DX: Moderate protein-calorie malnutrition: E44.0

## 2023-06-30 LAB — BASIC METABOLIC PANEL
Anion gap: 8 (ref 5–15)
BUN: 16 mg/dL (ref 8–23)
CO2: 27 mmol/L (ref 22–32)
Calcium: 9 mg/dL (ref 8.9–10.3)
Chloride: 92 mmol/L — ABNORMAL LOW (ref 98–111)
Creatinine, Ser: 0.74 mg/dL (ref 0.61–1.24)
GFR, Estimated: >60 mL/min (ref >60)
Glucose, Bld: 147 mg/dL — ABNORMAL HIGH (ref 70–99)
Potassium: 4.3 mmol/L (ref 3.5–5.1)
Sodium: 127 mmol/L — ABNORMAL LOW (ref 135–145)

## 2023-06-30 LAB — HIV ANTIBODY (ROUTINE TESTING W REFLEX): HIV Screen 4th Generation wRfx: NONREACTIVE

## 2023-06-30 MED ORDER — ADULT MULTIVITAMIN W/MINERALS CH: 1.0000 | ORAL_TABLET | Freq: Every day | ORAL | Status: DC

## 2023-06-30 MED ORDER — ENSURE ENLIVE PO LIQD: 237.0000 mL | Freq: Three times a day (TID) | ORAL | Status: DC

## 2023-06-30 MED ORDER — AZITHROMYCIN 250 MG PO TABS: 250.0000 mg | ORAL_TABLET | Freq: Every day | ORAL | 0 refills | Status: AC

## 2023-06-30 MED ORDER — PREDNISONE 20 MG PO TABS: 40.0000 mg | ORAL_TABLET | Freq: Every day | ORAL | 0 refills | Status: AC

## 2023-06-30 MED ORDER — MOMETASONE FURO-FORMOTEROL FUM 200-5 MCG/ACT IN AERO: 2.0000 | INHALATION_SPRAY | Freq: Two times a day (BID) | RESPIRATORY_TRACT | 0 refills | Status: DC

## 2023-06-30 MED ORDER — ALPRAZOLAM 1 MG PO TABS
1.0000 mg | ORAL_TABLET | Freq: Three times a day (TID) | ORAL | Status: DC | PRN
  Administered 2023-06-30: 1 mg via ORAL
  Filled 2023-06-30: qty 1

## 2023-06-30 NOTE — Progress Notes (Signed)
   06/30/23 1032  TOC Brief Assessment  Insurance and Status Reviewed  Patient has primary care physician Yes  Home environment has been reviewed from home  Prior level of function: independent  Prior/Current Home Services No current home services  Social Determinants of Health Reivew SDOH reviewed no interventions necessary  Readmission risk has been reviewed Yes  Transition of care needs no transition of care needs at this time   Pt may need Home O2 at dc. If so, TOC will assist.  Transition of Care Department [facility]) has reviewed patient and no other TOC needs have been identified at this time. We will continue to monitor patient advancement through interdisciplinary progression rounds. If new patient transition needs arise, please place a TOC consult.

## 2023-06-30 NOTE — Progress Notes (Signed)
Initial Nutrition Assessment  DOCUMENTATION CODES:   Non-severe (moderate) malnutrition in context of chronic illness  INTERVENTION:   Ensure Plus High Protein po TID, each supplement provides 350 kcal and 20 grams of protein. MVI with minerals daily.  NUTRITION DIAGNOSIS:   Moderate Malnutrition related to chronic illness (COPD) as evidenced by mild muscle depletion, moderate muscle depletion, mild fat depletion, moderate fat depletion.  GOAL:   Patient will meet greater than or equal to 90% of their needs  MONITOR:   PO intake, Supplement acceptance  REASON FOR ASSESSMENT:   Consult COPD Protocol  ASSESSMENT:   77 yo male admitted with AECOPD with SOB. PMH includes arthritis, bradycardia, HLD, macular degeneration.  Patient states he is a grazer, he eats small amounts throughout the day and one bigger meal at dinner. He was a little anxious at breakfast this morning and didn't eat a whole lot. Per documentation, he ate 75% of breakfast yesterday. He reports stable weight and good intake at home. He drinks a nutrition shake (Equate) every morning with his medications. He would like to receive Ensure supplements while in the hospital.    Labs reviewed. Na 127  Medications reviewed and include solu-medrol.  No recent weight encounters available for review. Patient reports no weight changes recently.   NUTRITION - FOCUSED PHYSICAL EXAM:  Flowsheet Row Most Recent Value  Orbital Region Moderate depletion  Upper Arm Region Moderate depletion  Thoracic and Lumbar Region Mild depletion  Buccal Region Moderate depletion  Temple Region Moderate depletion  Clavicle Bone Region Moderate depletion  Clavicle and Acromion Bone Region Mild depletion  Scapular Bone Region Moderate depletion  Dorsal Hand Mild depletion  Patellar Region Unable to assess  Anterior Thigh Region Unable to assess  Posterior Calf Region Unable to assess  Edema (RD Assessment) Unable to assess  Hair  Reviewed  Eyes Reviewed  Mouth Reviewed  Skin Reviewed  Nails Reviewed       Diet Order:   Diet Order             Diet regular Room service appropriate? Yes; Fluid consistency: Thin  Diet effective now                   EDUCATION NEEDS:   Education needs have been addressed  Skin:  Skin Assessment: Reviewed RN Assessment  Last BM:  unknown  Height:   Ht Readings from Last 1 Encounters:  06/29/23 5\' 8"  (1.727 m)    Weight:   Wt Readings from Last 1 Encounters:  06/29/23 76.7 kg    Ideal Body Weight:  70 kg  BMI:  Body mass index is 25.71 kg/m.  Estimated Nutritional Needs:   Kcal:  2000-2200 kcal  Protein:  100-115 gm  Fluid:  2-2.2 L   Gabriel Rainwater RD, LDN, CNSC Please refer to Amion for contact information.

## 2023-06-30 NOTE — Discharge Summary (Signed)
Physician Discharge Summary   Patient: Vincent Gaines MRN: [id number] DOB: [birth year]  Admit date:     06/29/2023  Discharge date: 06/30/23  Discharge Physician: Tyrone Nine   PCP: Benita Stabile, MD   Recommendations at discharge:  Follow up with PCP in the next week. Suggest formal PFTs after resolution of AECOPD.  Suggest recheck BMP with attention to hyponatremia and initiation of further work up if persistent. Na stable at 127, asymptomatic throughout hospitalization.   Discharge Diagnoses: Principal Problem:   Acute exacerbation of chronic obstructive pulmonary disease (COPD) (HCC) Active Problems:   Dyslipidemia   Hyponatremia   Malnutrition of moderate degree  Hospital Course: Vincent Gaines is a 77 y.o. male with a history of tobacco use ( ages 78 - 10), COPD, HLD, hyponatremia who presented to the ED last night with persistent cough productive of phlegm, and dyspnea. Given steroid en route and some improvement with nebulized therapy in ED. Still had evidence of bronchospasm so admission was requested.    Has had 3 days of cough worse at night, no fever or chills or chest pain. Eating ok. Got gradually worse last night made him worried for his life. Not one to come to hospital lightly. When given O2 he felt better and some better after breathing treatments but not back to baseline. With steroids, and breathing treatments he felt significantly improved and no longer hypoxemic the following morning on ambulatory pulse oximetry testing.  Assessment and Plan: Acute hypoxic respiratory failure due to AECOPD: Resolved wheezing though was hypoxemic this morning at rest and with ambulation. He reports feeling back to baseline regardless and requests discharge today.  - Plan to continue dulera (inhaler given to pt and Rx sent to pharmacy) - Continue albuterol inhaler at home (he has this) - Complete 5 days prednisone and azithromycin.  - Sputum Cx not sent. - Does not qualify  for supplemental oxygen.   HLD:  - Continue statin     Hyponatremia: Hx of same, appears asymptomatic.  - Recheck at follow up. Na 127   Anxiety: Continue alprazolam.  Moderate protein calorie malnutrition:  - Supp protein as able  Consultants: None Procedures performed: None  Disposition: Home Diet recommendation:  Cardiac diet DISCHARGE MEDICATION: Allergies as of 06/30/2023   No Known Allergies      Medication List     STOP taking these medications    fluticasone-salmeterol 250-50 MCG/ACT Aepb Commonly known as: ADVAIR Replaced by: mometasone-formoterol 200-5 MCG/ACT Aero       TAKE these medications    albuterol 108 (90 Base) MCG/ACT inhaler Commonly known as: VENTOLIN HFA Inhale 2 puffs into the lungs every 6 (six) hours as needed for wheezing or shortness of breath.   ALPRAZolam 1 MG tablet Commonly known as: XANAX Take 1 mg by mouth 3 (three) times daily as needed for anxiety or sleep. Take   azithromycin 250 MG tablet Commonly known as: ZITHROMAX Take 1 tablet (250 mg total) by mouth daily for 3 days. Start taking on: July 01, 2023   LIQUID TEARS OP Apply 1 drop to eye daily with breakfast.   losartan-hydrochlorothiazide 50-12.5 MG tablet Commonly known as: HYZAAR Take 1 tablet by mouth daily.   Methylcobalamin 1 MG Chew Take 1 tablet every day by oral route.   mometasone-formoterol 200-5 MCG/ACT Aero Commonly known as: DULERA Inhale 2 puffs into the lungs 2 (two) times daily. Replaces: fluticasone-salmeterol 250-50 MCG/ACT Aepb   predniSONE 20 MG tablet Commonly known as:  DELTASONE Take 2 tablets (40 mg total) by mouth daily with breakfast for 3 days. Start taking on: July 01, 2023   PreserVision AREDS 2 Caps Take 1 capsule by mouth daily.   simvastatin 40 MG tablet Commonly known as: ZOCOR Take 40 mg by mouth daily with breakfast.   tadalafil 20 MG tablet Commonly known as: CIALIS Take 20 mg by mouth daily.   thiamine  100 MG tablet Commonly known as: VITAMIN B1 Take 2.5 tablets (250 mg total) by mouth daily.   triamcinolone cream 0.1 % Commonly known as: KENALOG Apply 1 Application topically as needed (itching).               Durable Medical Equipment  (From admission, onward)           Start     Ordered   06/30/23 1235  DME Oxygen  Once       Question Answer Comment  Length of Need 6 Months   Mode or (Route) Nasal cannula   Liters per Minute 2   Frequency Continuous (stationary and portable oxygen unit needed)   Oxygen delivery system Gas      06/30/23 1235            Follow-up Information     Benita Stabile, MD Follow up in 1 week(s).   Specialty: Internal Medicine Contact information: [address] [place] [address] [phone]                Discharge Exam: Filed Weights   06/29/23 0350 06/29/23 0755  Weight: 74 kg 76.7 kg  BP (!) 141/75 (BP Location: Right Arm)   Pulse 83   Temp 98.6 F (37 C) (Oral)   Resp 19   Ht 5\' 8"  (1.727 m)   Wt 76.7 kg   SpO2 (S) (!) 82%   BMI 25.71 kg/m   Well-appearing older male in normal clothes pacing the room and halls requesting discharge Clear, nonlabored  Condition at discharge: stable  The results of significant diagnostics from this hospitalization (including imaging, microbiology, ancillary and laboratory) are listed below for reference.   Imaging Studies: DG Chest Port 1 View  Result Date: 06/29/2023 CLINICAL DATA:  Shortness of breath and chest pain EXAM: PORTABLE CHEST 1 VIEW COMPARISON:  02/01/2022 FINDINGS: Cardiac shadow is within normal limits. Aortic calcifications are noted. Multiple old rib fractures are noted bilaterally. Elevation of the right hemidiaphragm is seen. Mild right basilar atelectasis is noted. No acute bony abnormality is seen. IMPRESSION: No acute abnormality seen. Electronically Signed   By: Alcide Clever M.D.   On: 06/29/2023 04:20    Microbiology: Results for orders  placed or performed during the hospital encounter of 01/28/22  Culture, blood (routine x 2)     Status: None   Collection Time: 01/28/22  8:35 AM   Specimen: BLOOD LEFT ARM  Result Value Ref Range Status   Specimen Description BLOOD LEFT ARM  Final   Special Requests   Final    BOTTLES DRAWN AEROBIC AND ANAEROBIC Blood Culture adequate volume   Culture   Final    NO GROWTH 5 DAYS Performed at [facility], [address] [place], [place] [address]    Report Status 02/02/2022 FINAL  Final  Culture, blood (routine x 2)     Status: None   Collection Time: 01/28/22  8:42 AM   Specimen: BLOOD LEFT HAND  Result Value Ref Range Status   Specimen Description BLOOD LEFT HAND  Final   Special Requests   Final    BOTTLES DRAWN AEROBIC AND ANAEROBIC Blood Culture adequate volume   Culture   Final    NO GROWTH 5 DAYS Performed at [facility], [address], [place], [place] [address]    Report Status 02/02/2022 FINAL  Final  SARS Coronavirus 2 by RT PCR (hospital order, performed in [facility] hospital lab) *cepheid single result test* Anterior Nasal Swab     Status: None   Collection Time: 01/28/22 11:39 AM   Specimen: Anterior Nasal Swab  Result Value Ref Range Status   SARS Coronavirus 2 by RT PCR NEGATIVE NEGATIVE Final    Comment: (NOTE) SARS-CoV-2 target nucleic acids are NOT DETECTED.  The SARS-CoV-2 RNA is generally detectable in upper and lower respiratory specimens during the acute phase of infection. The lowest concentration of SARS-CoV-2 viral copies this assay can detect is 250 copies / mL. A negative result does not preclude SARS-CoV-2 infection and should not be used as the sole basis for treatment or other patient management decisions.  A negative result may occur with improper specimen collection / handling, submission of specimen other than nasopharyngeal swab, presence of viral mutation(s) within the areas targeted by this assay, and inadequate number of viral  copies (<250 copies / mL). A negative result must be combined with clinical observations, patient history, and epidemiological information.  Fact Sheet for Patients:   RoadLapTop.co.za  Fact Sheet for Healthcare Providers: http://kim-miller.com/  This test is not yet approved or  cleared by the [place] FDA and has been authorized for detection and/or diagnosis of SARS-CoV-2 by FDA under an Emergency Use Authorization (EUA).  This EUA will remain in effect (meaning this test can be used) for the duration of the COVID-19 declaration under Section 564(b)(1) of the Act, 21 U.S.C. section 360bbb-3(b)(1), unless the authorization is terminated or revoked sooner.  Performed at [facility], [address] [place], [place] [address]   MRSA Next Gen by PCR, Nasal     Status: None   Collection Time: 01/28/22  3:27 PM   Specimen: Nasal Mucosa; Nasal Swab  Result Value Ref Range Status   MRSA by PCR Next Gen NOT DETECTED NOT DETECTED Final    Comment: (NOTE) The GeneXpert MRSA Assay (FDA approved for NASAL specimens only), is one component of a comprehensive MRSA colonization surveillance program. It is not intended to diagnose MRSA infection nor to guide or monitor treatment for MRSA infections. Test performance is not FDA approved in patients less than 36 years old. Performed at [facility], [address] [place], [place] [address]     Labs: CBC: Recent Labs  Lab 06/29/23 0413  WBC 8.6  HGB 11.9*  HCT 34.8*  MCV 99.1  PLT 281   Basic Metabolic Panel: Recent Labs  Lab 06/29/23 0413 06/30/23 0439  NA 127* 127*  K 3.8 4.3  CL 90* 92*  CO2 29 27  GLUCOSE 113* 147*  BUN 8 16  CREATININE 0.74 0.74  CALCIUM 8.7* 9.0   Liver Function Tests: No results for input(s): "AST", "ALT", "ALKPHOS", "BILITOT", "PROT", "ALBUMIN" in the last 168 hours. CBG: No results for input(s): "GLUCAP" in the last 168 hours.  Discharge time  spent: greater than 30 minutes.  Signed: Tyrone Nine, MD [facility] 06/30/2023

## 2023-06-30 NOTE — TOC Progression Note (Signed)
Transition of Care [facility]) - Progression Note    Patient Details  Name: Vincent Gaines MRN: [id number] Date of Birth: [birth year]  Transition of Care [facility]) CM/SW Contact  Elliot Gault, LCSW Phone Number: 06/30/2023, 2:01 PM  Clinical Narrative:      Pt was discharged home before [facility] could follow up on possible home O2 needs. Spoke with MD who stated that they tested pt again prior to him leaving the hospital and he no longer met the qualification for the Home O2.       Expected Discharge Plan and Services         Expected Discharge Date: 06/30/23                                     Social Determinants of Health (SDOH) Interventions SDOH Screenings   Food Insecurity: No Food Insecurity (06/29/2023)  Housing: Low Risk  (06/29/2023)  Transportation Needs: No Transportation Needs (06/29/2023)  Utilities: Not At Risk (06/29/2023)  Tobacco Use: Medium Risk (06/29/2023)    Readmission Risk Interventions     No data to display

## 2023-07-05 DIAGNOSIS — J441 Chronic obstructive pulmonary disease with (acute) exacerbation: Secondary | ICD-10-CM | POA: Diagnosis not present

## 2023-07-05 DIAGNOSIS — L209 Atopic dermatitis, unspecified: Secondary | ICD-10-CM | POA: Diagnosis not present

## 2023-07-05 DIAGNOSIS — E871 Hypo-osmolality and hyponatremia: Secondary | ICD-10-CM | POA: Diagnosis not present

## 2023-07-05 DIAGNOSIS — Z7689 Persons encountering health services in other specified circumstances: Secondary | ICD-10-CM | POA: Diagnosis not present

## 2023-07-13 DIAGNOSIS — H353113 Nonexudative age-related macular degeneration, right eye, advanced atrophic without subfoveal involvement: Secondary | ICD-10-CM | POA: Diagnosis not present

## 2023-07-13 DIAGNOSIS — D3131 Benign neoplasm of right choroid: Secondary | ICD-10-CM | POA: Diagnosis not present

## 2023-07-13 DIAGNOSIS — H35371 Puckering of macula, right eye: Secondary | ICD-10-CM | POA: Diagnosis not present

## 2023-07-13 DIAGNOSIS — H353222 Exudative age-related macular degeneration, left eye, with inactive choroidal neovascularization: Secondary | ICD-10-CM | POA: Diagnosis not present

## 2023-08-25 ENCOUNTER — Other Ambulatory Visit: Payer: Self-pay

## 2023-08-25 ENCOUNTER — Encounter (HOSPITAL_COMMUNITY): Payer: Self-pay

## 2023-08-25 ENCOUNTER — Emergency Department (HOSPITAL_COMMUNITY): Payer: Medicare Other

## 2023-08-25 ENCOUNTER — Observation Stay (HOSPITAL_COMMUNITY)
Admission: EM | Admit: 2023-08-25 | Discharge: 2023-08-26 | Disposition: A | Discharge status: Home / Self Care | Payer: Medicare Other | Attending: Family Medicine | Admitting: Family Medicine

## 2023-08-25 DIAGNOSIS — J449 Chronic obstructive pulmonary disease, unspecified: Secondary | ICD-10-CM | POA: Diagnosis present

## 2023-08-25 DIAGNOSIS — R0602 Shortness of breath: Secondary | ICD-10-CM | POA: Diagnosis not present

## 2023-08-25 DIAGNOSIS — J441 Chronic obstructive pulmonary disease with (acute) exacerbation: Secondary | ICD-10-CM | POA: Diagnosis not present

## 2023-08-25 DIAGNOSIS — E871 Hypo-osmolality and hyponatremia: Secondary | ICD-10-CM | POA: Diagnosis not present

## 2023-08-25 DIAGNOSIS — Z79899 Other long term (current) drug therapy: Secondary | ICD-10-CM | POA: Insufficient documentation

## 2023-08-25 DIAGNOSIS — L259 Unspecified contact dermatitis, unspecified cause: Secondary | ICD-10-CM | POA: Insufficient documentation

## 2023-08-25 DIAGNOSIS — Z96652 Presence of left artificial knee joint: Secondary | ICD-10-CM | POA: Diagnosis not present

## 2023-08-25 DIAGNOSIS — E785 Hyperlipidemia, unspecified: Secondary | ICD-10-CM | POA: Diagnosis present

## 2023-08-25 DIAGNOSIS — R069 Unspecified abnormalities of breathing: Secondary | ICD-10-CM | POA: Diagnosis not present

## 2023-08-25 DIAGNOSIS — L309 Dermatitis, unspecified: Secondary | ICD-10-CM | POA: Diagnosis not present

## 2023-08-25 DIAGNOSIS — Z743 Need for continuous supervision: Secondary | ICD-10-CM | POA: Diagnosis not present

## 2023-08-25 DIAGNOSIS — R6889 Other general symptoms and signs: Secondary | ICD-10-CM | POA: Diagnosis not present

## 2023-08-25 DIAGNOSIS — Z87891 Personal history of nicotine dependence: Secondary | ICD-10-CM | POA: Diagnosis not present

## 2023-08-25 LAB — CBC WITH DIFFERENTIAL/PLATELET
Abs Immature Granulocytes: 0.02 10*3/uL (ref 0.00–0.07)
Basophils Absolute: 0.1 10*3/uL (ref 0.0–0.1)
Basophils Relative: 1 %
Eosinophils Absolute: 0.1 10*3/uL (ref 0.0–0.5)
Eosinophils Relative: 3 %
HCT: 33.7 % — ABNORMAL LOW (ref 39.0–52.0)
Hemoglobin: 11.7 g/dL — ABNORMAL LOW (ref 13.0–17.0)
Immature Granulocytes: 0 %
Lymphocytes Relative: 15 %
Lymphs Abs: 0.8 10*3/uL (ref 0.7–4.0)
MCH: 34.1 pg — ABNORMAL HIGH (ref 26.0–34.0)
MCHC: 34.7 g/dL (ref 30.0–36.0)
MCV: 98.3 fL (ref 80.0–100.0)
Monocytes Absolute: 1.4 10*3/uL — ABNORMAL HIGH (ref 0.1–1.0)
Monocytes Relative: 25 %
Neutro Abs: 3 10*3/uL (ref 1.7–7.7)
Neutrophils Relative %: 56 %
Platelets: 253 10*3/uL (ref 150–400)
RBC: 3.43 MIL/uL — ABNORMAL LOW (ref 4.22–5.81)
RDW: 16.8 % — ABNORMAL HIGH (ref 11.5–15.5)
WBC: 5.4 10*3/uL (ref 4.0–10.5)
nRBC: 0 % (ref 0.0–0.2)

## 2023-08-25 LAB — OSMOLALITY, URINE: Osmolality, Ur: 126 mosm/kg — ABNORMAL LOW (ref 300–900)

## 2023-08-25 LAB — BASIC METABOLIC PANEL
Anion gap: 11 (ref 5–15)
BUN: <5 mg/dL — ABNORMAL LOW (ref 8–23)
CO2: 26 mmol/L (ref 22–32)
Calcium: 8.6 mg/dL — ABNORMAL LOW (ref 8.9–10.3)
Chloride: 85 mmol/L — ABNORMAL LOW (ref 98–111)
Creatinine, Ser: 0.61 mg/dL (ref 0.61–1.24)
GFR, Estimated: >60 mL/min (ref >60)
Glucose, Bld: 120 mg/dL — ABNORMAL HIGH (ref 70–99)
Potassium: 4 mmol/L (ref 3.5–5.1)
Sodium: 122 mmol/L — ABNORMAL LOW (ref 135–145)

## 2023-08-25 LAB — OSMOLALITY: Osmolality: 270 mosm/kg — ABNORMAL LOW (ref 275–295)

## 2023-08-25 LAB — SODIUM, URINE, RANDOM: Sodium, Ur: 16 mmol/L

## 2023-08-25 MED ORDER — FOLIC ACID 1 MG PO TABS
1.0000 mg | ORAL_TABLET | Freq: Every day | ORAL | Status: DC
  Administered 2023-08-25 – 2023-08-26 (×2): 1 mg via ORAL
  Filled 2023-08-25 (×2): qty 1

## 2023-08-25 MED ORDER — PREDNISONE 20 MG PO TABS
40.0000 mg | ORAL_TABLET | Freq: Every day | ORAL | Status: DC
  Administered 2023-08-26: 40 mg via ORAL
  Filled 2023-08-25: qty 2

## 2023-08-25 MED ORDER — OCUVITE-LUTEIN PO CAPS
1.0000 | ORAL_CAPSULE | Freq: Every day | ORAL | Status: DC
  Administered 2023-08-26: 1 via ORAL
  Filled 2023-08-25 (×2): qty 1

## 2023-08-25 MED ORDER — ALPRAZOLAM 1 MG PO TABS
1.0000 mg | ORAL_TABLET | Freq: Three times a day (TID) | ORAL | Status: DC | PRN
  Administered 2023-08-26: 1 mg via ORAL
  Filled 2023-08-25: qty 1

## 2023-08-25 MED ORDER — MOMETASONE FURO-FORMOTEROL FUM 200-5 MCG/ACT IN AERO
2.0000 | INHALATION_SPRAY | Freq: Two times a day (BID) | RESPIRATORY_TRACT | Status: DC
  Administered 2023-08-25 – 2023-08-26 (×2): 2 via RESPIRATORY_TRACT
  Filled 2023-08-25: qty 8.8

## 2023-08-25 MED ORDER — ADULT MULTIVITAMIN W/MINERALS CH
1.0000 | ORAL_TABLET | Freq: Every day | ORAL | Status: DC
  Administered 2023-08-25 – 2023-08-26 (×2): 1 via ORAL
  Filled 2023-08-25 (×2): qty 1

## 2023-08-25 MED ORDER — ALBUTEROL SULFATE (2.5 MG/3ML) 0.083% IN NEBU: 2.5000 mg | INHALATION_SOLUTION | RESPIRATORY_TRACT | Status: DC | PRN

## 2023-08-25 MED ORDER — LORAZEPAM 1 MG PO TABS: 1.0000 mg | ORAL_TABLET | ORAL | Status: DC | PRN

## 2023-08-25 MED ORDER — SODIUM CHLORIDE 0.9 % IV BOLUS
1000.0000 mL | Freq: Once | INTRAVENOUS | Status: AC
Start: 2023-08-25 — End: 2023-08-25
  Administered 2023-08-25: 1000 mL via INTRAVENOUS

## 2023-08-25 MED ORDER — ENOXAPARIN SODIUM 40 MG/0.4ML IJ SOSY
40.0000 mg | PREFILLED_SYRINGE | INTRAMUSCULAR | Status: DC
  Administered 2023-08-26: 40 mg via SUBCUTANEOUS
  Filled 2023-08-25: qty 0.4

## 2023-08-25 MED ORDER — LOSARTAN POTASSIUM 50 MG PO TABS
50.0000 mg | ORAL_TABLET | Freq: Every day | ORAL | Status: DC
  Administered 2023-08-25 – 2023-08-26 (×2): 50 mg via ORAL
  Filled 2023-08-25 (×2): qty 1

## 2023-08-25 MED ORDER — HYDROCORTISONE 1 % EX CREA
TOPICAL_CREAM | Freq: Two times a day (BID) | CUTANEOUS | Status: DC
  Filled 2023-08-25 (×2): qty 28

## 2023-08-25 MED ORDER — SIMVASTATIN 20 MG PO TABS
40.0000 mg | ORAL_TABLET | Freq: Every day | ORAL | Status: DC
  Administered 2023-08-26: 40 mg via ORAL
  Filled 2023-08-25: qty 2

## 2023-08-25 MED ORDER — MOMETASONE FURO-FORMOTEROL FUM 200-5 MCG/ACT IN AERO: 2.0000 | INHALATION_SPRAY | Freq: Two times a day (BID) | RESPIRATORY_TRACT | Status: DC

## 2023-08-25 MED ORDER — HYDRALAZINE HCL 20 MG/ML IJ SOLN: 5.0000 mg | INTRAMUSCULAR | Status: DC | PRN

## 2023-08-25 MED ORDER — LORAZEPAM 2 MG/ML IJ SOLN: 1.0000 mg | INTRAMUSCULAR | Status: DC | PRN

## 2023-08-25 MED ORDER — IPRATROPIUM-ALBUTEROL 0.5-2.5 (3) MG/3ML IN SOLN
3.0000 mL | Freq: Two times a day (BID) | RESPIRATORY_TRACT | Status: DC
  Administered 2023-08-26: 3 mL via RESPIRATORY_TRACT
  Filled 2023-08-25: qty 3

## 2023-08-25 MED ORDER — THIAMINE MONONITRATE 100 MG PO TABS
250.0000 mg | ORAL_TABLET | Freq: Every day | ORAL | Status: DC
  Administered 2023-08-25 – 2023-08-26 (×2): 250 mg via ORAL
  Filled 2023-08-25 (×2): qty 3

## 2023-08-25 MED ORDER — IPRATROPIUM-ALBUTEROL 0.5-2.5 (3) MG/3ML IN SOLN
3.0000 mL | Freq: Four times a day (QID) | RESPIRATORY_TRACT | Status: DC
  Administered 2023-08-25: 3 mL via RESPIRATORY_TRACT
  Filled 2023-08-25: qty 3

## 2023-08-25 NOTE — ED Triage Notes (Signed)
EMS states patient called them out to the home for shortness of breath, patient found to be wheezing heard in a lung fields. Give 2.5 albuterol which helped with breathing. Pt denies chest pain and history of asthma or COPD
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
## 2023-08-25 NOTE — ED Provider Notes (Signed)
[name] EMERGENCY DEPARTMENT AT [facility] Provider Note   CSN: [id number] Arrival date & time: 08/25/23  1554     History  Chief Complaint  Patient presents with   Shortness of Breath    EMS called out by pt for SOB without chest pain. EMS states patient relayed to them he felt like it was more an anxiety attack he was having.     Vincent Gaines is a 78 y.o. male.  Patient complains of some shortness of breath and weakness.  Patient has a history of COPD  The history is provided by the patient and medical records. No language interpreter was used.  Shortness of Breath Severity:  Mild Onset quality:  Sudden Timing:  Constant Progression:  Worsening Chronicity:  Recurrent Context: activity   Relieved by:  Nothing Worsened by:  Nothing Associated symptoms: no abdominal pain, no chest pain, no cough, no headaches and no rash        Home Medications Prior to Admission medications   Medication Sig Start Date End Date Taking? Authorizing Provider  albuterol (VENTOLIN HFA) 108 (90 Base) MCG/ACT inhaler Inhale 2 puffs into the lungs every 6 (six) hours as needed for wheezing or shortness of breath. 02/01/22   Vassie Loll, MD  ALPRAZolam Prudy Feeler) 1 MG tablet Take 1 mg by mouth 3 (three) times daily as needed for anxiety or sleep. Take    [provider]  losartan-hydrochlorothiazide (HYZAAR) 50-12.5 MG tablet Take 1 tablet by mouth daily. 06/21/23   [provider]  Methylcobalamin 1 MG CHEW Take 1 tablet every day by oral route.    [provider]  mometasone-formoterol (DULERA) 200-5 MCG/ACT AERO Inhale 2 puffs into the lungs 2 (two) times daily. 06/30/23   Tyrone Nine, MD  Multiple Vitamins-Minerals (PRESERVISION AREDS 2) CAPS Take 1 capsule by mouth daily.    [provider]  Polyvinyl Alcohol (LIQUID TEARS OP) Apply 1 drop to eye daily with breakfast.    [provider]  simvastatin (ZOCOR) 40 MG tablet Take  40 mg by mouth daily with breakfast.    [provider]  tadalafil (CIALIS) 20 MG tablet Take 20 mg by mouth daily. 05/19/23   [provider]  thiamine 100 MG tablet Take 2.5 tablets (250 mg total) by mouth daily. 02/02/22   Vassie Loll, MD  triamcinolone cream (KENALOG) 0.1 % Apply 1 Application topically as needed (itching). 11/04/20   [provider]      Allergies    Patient has no known allergies.    Review of Systems   Review of Systems  Constitutional:  Positive for fatigue. Negative for appetite change.  HENT:  Negative for congestion, ear discharge and sinus pressure.   Eyes:  Negative for discharge.  Respiratory:  Positive for shortness of breath. Negative for cough.   Cardiovascular:  Negative for chest pain.  Gastrointestinal:  Negative for abdominal pain and diarrhea.  Genitourinary:  Negative for frequency and hematuria.  Musculoskeletal:  Negative for back pain.  Skin:  Negative for rash.  Neurological:  Negative for seizures and headaches.  Psychiatric/Behavioral:  Negative for hallucinations.     Physical Exam Updated Vital Signs BP (!) 164/82 (BP Location: Right Arm)   Pulse 74   Temp (!) 97.5 F (36.4 C) (Oral)   Resp 20   Ht 5\' 8"  (1.727 m)   Wt 79.4 kg   SpO2 98%   BMI 26.61 kg/m  Physical Exam Vitals and nursing note  reviewed.  Constitutional:      Appearance: He is well-developed.  HENT:     Head: Normocephalic.     Nose: Nose normal.  Eyes:     General: No scleral icterus.    Conjunctiva/sclera: Conjunctivae normal.  Neck:     Thyroid: No thyromegaly.  Cardiovascular:     Rate and Rhythm: Normal rate and regular rhythm.     Heart sounds: No murmur heard.    No friction rub. No gallop.  Pulmonary:     Breath sounds: No stridor. No wheezing or rales.  Chest:     Chest wall: No tenderness.  Abdominal:     General: There is no distension.     Tenderness: There is no abdominal tenderness. There is no rebound.   Musculoskeletal:        General: Normal range of motion.     Cervical back: Neck supple.  Lymphadenopathy:     Cervical: No cervical adenopathy.  Skin:    Findings: No erythema or rash.  Neurological:     Mental Status: He is alert and oriented to person, place, and time.     Motor: No abnormal muscle tone.     Coordination: Coordination normal.  Psychiatric:        Behavior: Behavior normal.     ED Results / Procedures / Treatments   Labs (all labs ordered are listed, but only abnormal results are displayed) Labs Reviewed  CBC WITH DIFFERENTIAL/PLATELET - Abnormal; Notable for the following components:      Result Value   RBC 3.43 (*)    Hemoglobin 11.7 (*)    HCT 33.7 (*)    MCH 34.1 (*)    RDW 16.8 (*)    Monocytes Absolute 1.4 (*)    All other components within normal limits  BASIC METABOLIC PANEL - Abnormal; Notable for the following components:   Sodium 122 (*)    Chloride 85 (*)    Glucose, Bld 120 (*)    BUN <5 (*)    Calcium 8.6 (*)    All other components within normal limits  SODIUM, URINE, RANDOM  OSMOLALITY, URINE  OSMOLALITY    EKG None  Radiology DG Chest Port 1 View Result Date: 08/25/2023 CLINICAL DATA:  Shortness of breath. EXAM: PORTABLE CHEST 1 VIEW COMPARISON:  June 29, 2023. FINDINGS: Stable cardiomediastinal silhouette. Old left rib fractures are noted. Minimal bibasilar subsegmental atelectasis or scarring is noted. IMPRESSION: Minimal bibasilar subsegmental atelectasis or scarring. Electronically Signed   By: Lupita Raider M.D.   On: 08/25/2023 17:14    Procedures Procedures    Medications Ordered in ED Medications  sodium chloride 0.9 % bolus 1,000 mL (has no administration in time range)    ED Course/ Medical Decision Making/ A&P   CRITICAL CARE Performed by: Bethann Berkshire Total critical care time: 40 minutes Critical care time was exclusive of separately billable procedures and treating other patients. Critical care  was necessary to treat or prevent imminent or life-threatening deterioration. Critical care was time spent personally by me on the following activities: development of treatment plan with patient and/or surrogate as well as nursing, discussions with consultants, evaluation of patient's response to treatment, examination of patient, obtaining history from patient or surrogate, ordering and performing treatments and interventions, ordering and review of laboratory studies, ordering and review of radiographic studies, pulse oximetry and re-evaluation of patient's condition.      Patient COPD improved with neb treatments.  He is hyponatremic with admitted  to medicine                               Medical Decision Making Amount and/or Complexity of Data Reviewed Labs: ordered. Radiology: ordered.  Risk Decision regarding hospitalization.  This patient presents to the ED for concern of shortness of breath and fatigue, this involves an extensive number of treatment options, and is a complaint that carries with it a high risk of complications and morbidity.  The differential diagnosis includes pneumonia, PE   Co morbidities that complicate the patient evaluation  COPD   Additional history obtained:  Additional history obtained from patient External records from outside source obtained and reviewed including hospital records   Lab Tests:  I Ordered, and personally interpreted labs.  The pertinent results include: Sodium 122   Imaging Studies ordered:  I ordered imaging studies including chest x-ray I independently visualized and interpreted imaging which showed negative I agree with the radiologist interpretation   Cardiac Monitoring: / EKG:  The patient was maintained on a cardiac monitor.  I personally viewed and interpreted the cardiac monitored which showed an underlying rhythm of: Normal sinus rhythm   Consultations Obtained:  I requested consultation with the  hospitalist,  and discussed lab and imaging findings as well as pertinent plan - they recommend: Admit   Problem List / ED Course / Critical interventions / Medication management  COPD and hyponatremia I ordered medication including normal saline for COPD Reevaluation of the patient after these medicines showed that the patient improved I have reviewed the patients home medicines and have made adjustments as needed   Social Determinants of Health:  None   Test / Admission - Considered:  None  Patient with hyponatremia and improvement with his COPD.  He will be admitted to medicine and treated for his hyponatremia, most likely related to the HCTZ        Final Clinical Impression(s) / ED Diagnoses Final diagnoses:  None    Rx / DC Orders ED Discharge Orders     None         Bethann Berkshire, MD 08/27/23 1113

## 2023-08-26 DIAGNOSIS — E871 Hypo-osmolality and hyponatremia: Secondary | ICD-10-CM | POA: Diagnosis not present

## 2023-08-26 LAB — CBC
HCT: 34.5 % — ABNORMAL LOW (ref 39.0–52.0)
Hemoglobin: 11.8 g/dL — ABNORMAL LOW (ref 13.0–17.0)
MCH: 33.1 pg (ref 26.0–34.0)
MCHC: 34.2 g/dL (ref 30.0–36.0)
MCV: 96.6 fL (ref 80.0–100.0)
Platelets: 259 10*3/uL (ref 150–400)
RBC: 3.57 MIL/uL — ABNORMAL LOW (ref 4.22–5.81)
RDW: 16.6 % — ABNORMAL HIGH (ref 11.5–15.5)
WBC: 5.5 10*3/uL (ref 4.0–10.5)
nRBC: 0 % (ref 0.0–0.2)

## 2023-08-26 LAB — BASIC METABOLIC PANEL
Anion gap: 7 (ref 5–15)
BUN: 6 mg/dL — ABNORMAL LOW (ref 8–23)
CO2: 29 mmol/L (ref 22–32)
Calcium: 8.8 mg/dL — ABNORMAL LOW (ref 8.9–10.3)
Chloride: 91 mmol/L — ABNORMAL LOW (ref 98–111)
Creatinine, Ser: 0.61 mg/dL (ref 0.61–1.24)
GFR, Estimated: >60 mL/min (ref >60)
Glucose, Bld: 101 mg/dL — ABNORMAL HIGH (ref 70–99)
Potassium: 4.1 mmol/L (ref 3.5–5.1)
Sodium: 127 mmol/L — ABNORMAL LOW (ref 135–145)

## 2023-08-26 MED ORDER — MOMETASONE FURO-FORMOTEROL FUM 200-5 MCG/ACT IN AERO: 2.0000 | INHALATION_SPRAY | Freq: Two times a day (BID) | RESPIRATORY_TRACT | 0 refills | Status: AC

## 2023-08-26 MED ORDER — ALBUTEROL SULFATE HFA 108 (90 BASE) MCG/ACT IN AERS: 2.0000 | INHALATION_SPRAY | Freq: Four times a day (QID) | RESPIRATORY_TRACT | 0 refills | Status: AC | PRN

## 2023-08-26 MED ORDER — LOSARTAN POTASSIUM 50 MG PO TABS: 50.0000 mg | ORAL_TABLET | Freq: Every day | ORAL | 0 refills | Status: AC

## 2023-08-26 MED ORDER — PREDNISONE 20 MG PO TABS: 40.0000 mg | ORAL_TABLET | Freq: Every day | ORAL | 0 refills | Status: AC

## 2023-08-26 NOTE — Discharge Summary (Signed)
Physician Discharge Summary   Patient: Vincent Gaines MRN: [id number] DOB: [birth year]  Admit date:     08/25/2023  Discharge date: 08/26/23  Discharge Physician: Tyrone Nine   PCP: Benita Stabile, MD   Recommendations at discharge:  Follow up with PCP in next 1-2 weeks for ongoing COPD treatment and repeat BMP with attention toward hyponatremia for which thiazide is held at discharge. Continue EtOH moderation/cessation counseling.  Suggest dermatology follow up for eczematous eruptions which are chronic.  Discharge Diagnoses: Principal Problem:   Hyponatremia Active Problems:   Dyslipidemia   Eczema   Acute exacerbation of chronic obstructive pulmonary disease (COPD) [facility])   Hospital Course:  Vincent Gaines  is a 78 y.o. male, with a history of past tobacco use ( ages 70 - 51), COPD, HLD, hyponatremia, hypertension, alcohol abuse . -Patient presents to ED secondary to complaints of shortness of breath, reports ongoing for last 2 to 3 days, he denies fever, chills, productive phlegm, he is supposed to be on Dulera and albuterol inhalers, but has not been using his inhalers, as well he does report generalized weakness, fatigue and poor  oral intake . -In ED his workup significant for wheezing which has improved with nebulizer, but he is not on any oxygen requirement, labs were significant for sodium of 122, so [facility] hospitalist requested to admit, patient endorsed drinking 3-4 beers every night, and he still takes his hydrochlorothiazide.   He was given inhalers, steroids, and IV fluids with improvement in symptoms.  Assessment and Plan: Euvolemic hyponatremia Beer potomania -Sodium of 122, most recently 127, patient drink 3-4 beers every night, he is on hydrochlorothiazide, and with decreased oral intake as well. Na improved to 127 with IVF and withholding thiazide.  -Counseled to stop drinking beer, -Hold hydrochlorothiazide   Acute COPD exacerbation -Filed, no hypoxia, and  oxygen requirement. No wheezing the following morning after admission. Will complete prednisone burst (which has historically helped dermatitis as well) and re-prescribe dulera and albuterol inhalers.   Hypertension - New prescription for losartan monotherapy given at discharge, DC thiazide as the patient has had multiple episodes of severe hyponatremia.   HLD:  - Continue statin     Anxiety:  -Continue alprazolam.   Bilateral feet eczema -Start on hydrocortisone ointment, he was instructed to follow with his dermatologist   Alcohol abuse: Continue cessation/moderation counseling. No evidence of withdrawal during time here.   Consultants: None Procedures performed: None  Disposition: Home Diet recommendation: Regular DISCHARGE MEDICATION: Allergies as of 08/26/2023   No Known Allergies      Medication List     STOP taking these medications    losartan-hydrochlorothiazide 50-12.5 MG tablet Commonly known as: HYZAAR       TAKE these medications    albuterol 108 (90 Base) MCG/ACT inhaler Commonly known as: VENTOLIN HFA Inhale 2 puffs into the lungs every 6 (six) hours as needed for wheezing or shortness of breath.   ALPRAZolam 1 MG tablet Commonly known as: XANAX Take 1 mg by mouth 3 (three) times daily as needed for anxiety or sleep. Take   guaiFENesin 100 MG/5ML liquid Commonly known as: ROBITUSSIN Take 10 mLs by mouth at bedtime. Liquid mucinex d/t being out of mucinex tabs   lactose free nutrition Liqd Take 237 mLs by mouth daily.   LIQUID TEARS OP Apply 1 drop to eye daily with breakfast.   losartan 50 MG tablet Commonly known as: Cozaar Take 1 tablet (50 mg total) by mouth daily.  mometasone-formoterol 200-5 MCG/ACT Aero Commonly known as: DULERA Inhale 2 puffs into the lungs 2 (two) times daily.   predniSONE 20 MG tablet Commonly known as: DELTASONE Take 2 tablets (40 mg total) by mouth daily with breakfast for 4 days. Start taking on: August 27, 2023   PreserVision AREDS 2 Caps Take 1 capsule by mouth daily.   simvastatin 40 MG tablet Commonly known as: ZOCOR Take 40 mg by mouth daily with breakfast.   tadalafil 20 MG tablet Commonly known as: CIALIS Take 20 mg by mouth daily.   VITAMIN B-12 PO Take 1 tablet by mouth daily.        Follow-up Information     Benita Stabile, MD Follow up.   Specialty: Internal Medicine Contact information: [address] [place] [facility] [address] [phone]                Discharge Exam: Filed Weights   08/25/23 1617  Weight: 79.4 kg  BP 122/62   Pulse 78   Temp 98.3 F (36.8 C) (Oral)   Resp 16   Ht 5\' 8"  (1.727 m)   Wt 79.4 kg   SpO2 96%   BMI 26.61 kg/m   Well-appearing male in no distress Clear, nonlabored RRR Soft, NT, ND Bipedal edema with erythema and severe hyperkeratotic features, stable from admission.  Condition at discharge: stable  The results of significant diagnostics from this hospitalization (including imaging, microbiology, ancillary and laboratory) are listed below for reference.   Imaging Studies: DG Chest Port 1 View Result Date: 08/25/2023 CLINICAL DATA:  Shortness of breath. EXAM: PORTABLE CHEST 1 VIEW COMPARISON:  June 29, 2023. FINDINGS: Stable cardiomediastinal silhouette. Old left rib fractures are noted. Minimal bibasilar subsegmental atelectasis or scarring is noted. IMPRESSION: Minimal bibasilar subsegmental atelectasis or scarring. Electronically Signed   By: Lupita Raider M.D.   On: 08/25/2023 17:14    Microbiology: Results for orders placed or performed during the hospital encounter of 01/28/22  Culture, blood (routine x 2)     Status: None   Collection Time: 01/28/22  8:35 AM   Specimen: BLOOD LEFT ARM  Result Value Ref Range Status   Specimen Description BLOOD LEFT ARM  Final   Special Requests   Final    BOTTLES DRAWN AEROBIC AND ANAEROBIC Blood Culture adequate volume   Culture   Final    NO GROWTH 5  DAYS Performed at [facility], [address], [place], [place] [address]    Report Status 02/02/2022 FINAL  Final  Culture, blood (routine x 2)     Status: None   Collection Time: 01/28/22  8:42 AM   Specimen: BLOOD LEFT HAND  Result Value Ref Range Status   Specimen Description BLOOD LEFT HAND  Final   Special Requests   Final    BOTTLES DRAWN AEROBIC AND ANAEROBIC Blood Culture adequate volume   Culture   Final    NO GROWTH 5 DAYS Performed at [facility], [address], [place], [place] [address]    Report Status 02/02/2022 FINAL  Final  SARS Coronavirus 2 by RT PCR (hospital order, performed in [facility] hospital lab) *cepheid single result test* Anterior Nasal Swab     Status: None   Collection Time: 01/28/22 11:39 AM   Specimen: Anterior Nasal Swab  Result Value Ref Range Status   SARS Coronavirus 2 by RT PCR NEGATIVE NEGATIVE Final    Comment: (NOTE) SARS-CoV-2 target nucleic acids are NOT DETECTED.  The  SARS-CoV-2 RNA is generally detectable in upper and lower respiratory specimens during the acute phase of infection. The lowest concentration of SARS-CoV-2 viral copies this assay can detect is 250 copies / mL. A negative result does not preclude SARS-CoV-2 infection and should not be used as the sole basis for treatment or other patient management decisions.  A negative result may occur with improper specimen collection / handling, submission of specimen other than nasopharyngeal swab, presence of viral mutation(s) within the areas targeted by this assay, and inadequate number of viral copies (<250 copies / mL). A negative result must be combined with clinical observations, patient history, and epidemiological information.  Fact Sheet for Patients:   RoadLapTop.co.za  Fact Sheet for Healthcare Providers: http://kim-miller.com/  This test is not yet approved or  cleared by the [place] FDA and has been authorized  for detection and/or diagnosis of SARS-CoV-2 by FDA under an Emergency Use Authorization (EUA).  This EUA will remain in effect (meaning this test can be used) for the duration of the COVID-19 declaration under Section 564(b)(1) of the Act, 21 U.S.C. section 360bbb-3(b)(1), unless the authorization is terminated or revoked sooner.  Performed at [facility], [address], [place], [place] [address]   MRSA Next Gen by PCR, Nasal     Status: None   Collection Time: 01/28/22  3:27 PM   Specimen: Nasal Mucosa; Nasal Swab  Result Value Ref Range Status   MRSA by PCR Next Gen NOT DETECTED NOT DETECTED Final    Comment: (NOTE) The GeneXpert MRSA Assay (FDA approved for NASAL specimens only), is one component of a comprehensive MRSA colonization surveillance program. It is not intended to diagnose MRSA infection nor to guide or monitor treatment for MRSA infections. Test performance is not FDA approved in patients less than 9 years old. Performed at [facility], [address] [place], [place] [address]     Labs: CBC: Recent Labs  Lab 08/25/23 1707 08/26/23 0552  WBC 5.4 5.5  NEUTROABS 3.0  --   HGB 11.7* 11.8*  HCT 33.7* 34.5*  MCV 98.3 96.6  PLT 253 259   Basic Metabolic Panel: Recent Labs  Lab 08/25/23 1707 08/26/23 0552  NA 122* 127*  K 4.0 4.1  CL 85* 91*  CO2 26 29  GLUCOSE 120* 101*  BUN <5* 6*  CREATININE 0.61 0.61  CALCIUM 8.6* 8.8*   Liver Function Tests: No results for input(s): "AST", "ALT", "ALKPHOS", "BILITOT", "PROT", "ALBUMIN" in the last 168 hours. CBG: No results for input(s): "GLUCAP" in the last 168 hours.  Discharge time spent: greater than 30 minutes.  Signed: Tyrone Nine, MD [facility] 08/26/2023

## 2023-08-26 NOTE — TOC CM/SW Note (Signed)
Transition of Care [facility]) - Inpatient Brief Assessment   Patient Details  Name: Vincent Gaines MRN: [id number] Date of Birth: 06/23/1946  Transition of Care [facility]) CM/SW Contact:    Villa Herb, LCSWA Phone Number: 08/26/2023, 10:07 AM   Clinical Narrative: Substance use resources added to AVS for pt to review at D/C.   Transition of Care Department [facility]) has reviewed patient and no TOC needs have been identified at this time. We will continue to monitor patient advancement through interdisciplinary progression rounds. If new patient transition needs arise, please place a TOC consult.  Transition of Care Asessment: Insurance and Status: Insurance coverage has been reviewed Patient has primary care physician: Yes Home environment has been reviewed: From home Prior level of function:: Independent Prior/Current Home Services: No current home services Social Drivers of Health Review: SDOH reviewed no interventions necessary Readmission risk has been reviewed: Yes Transition of care needs: no transition of care needs at this time

## 2023-09-11 ENCOUNTER — Observation Stay (HOSPITAL_COMMUNITY)
Admission: EM | Admit: 2023-09-11 | Discharge: 2023-09-12 | Disposition: A | Discharge status: Home Health | Payer: Medicare Other | Attending: Internal Medicine | Admitting: Internal Medicine

## 2023-09-11 ENCOUNTER — Other Ambulatory Visit: Payer: Self-pay

## 2023-09-11 ENCOUNTER — Emergency Department (HOSPITAL_COMMUNITY): Payer: Medicare Other

## 2023-09-11 ENCOUNTER — Encounter (HOSPITAL_COMMUNITY): Payer: Self-pay | Admitting: Emergency Medicine

## 2023-09-11 ENCOUNTER — Other Ambulatory Visit (HOSPITAL_COMMUNITY): Payer: Medicare Other

## 2023-09-11 DIAGNOSIS — J9811 Atelectasis: Secondary | ICD-10-CM | POA: Diagnosis not present

## 2023-09-11 DIAGNOSIS — F1010 Alcohol abuse, uncomplicated: Secondary | ICD-10-CM | POA: Diagnosis present

## 2023-09-11 DIAGNOSIS — R109 Unspecified abdominal pain: Secondary | ICD-10-CM | POA: Insufficient documentation

## 2023-09-11 DIAGNOSIS — J449 Chronic obstructive pulmonary disease, unspecified: Secondary | ICD-10-CM | POA: Diagnosis present

## 2023-09-11 DIAGNOSIS — I499 Cardiac arrhythmia, unspecified: Secondary | ICD-10-CM | POA: Diagnosis not present

## 2023-09-11 DIAGNOSIS — G9341 Metabolic encephalopathy: Secondary | ICD-10-CM | POA: Insufficient documentation

## 2023-09-11 DIAGNOSIS — R778 Other specified abnormalities of plasma proteins: Secondary | ICD-10-CM | POA: Insufficient documentation

## 2023-09-11 DIAGNOSIS — S199XXA Unspecified injury of neck, initial encounter: Secondary | ICD-10-CM | POA: Diagnosis not present

## 2023-09-11 DIAGNOSIS — R4182 Altered mental status, unspecified: Secondary | ICD-10-CM | POA: Diagnosis present

## 2023-09-11 DIAGNOSIS — Z743 Need for continuous supervision: Secondary | ICD-10-CM | POA: Diagnosis not present

## 2023-09-11 DIAGNOSIS — I10 Essential (primary) hypertension: Secondary | ICD-10-CM | POA: Insufficient documentation

## 2023-09-11 DIAGNOSIS — F419 Anxiety disorder, unspecified: Secondary | ICD-10-CM | POA: Diagnosis not present

## 2023-09-11 DIAGNOSIS — Z96652 Presence of left artificial knee joint: Secondary | ICD-10-CM | POA: Insufficient documentation

## 2023-09-11 DIAGNOSIS — E872 Acidosis, unspecified: Secondary | ICD-10-CM | POA: Diagnosis not present

## 2023-09-11 DIAGNOSIS — E785 Hyperlipidemia, unspecified: Secondary | ICD-10-CM | POA: Insufficient documentation

## 2023-09-11 DIAGNOSIS — F10920 Alcohol use, unspecified with intoxication, uncomplicated: Principal | ICD-10-CM

## 2023-09-11 DIAGNOSIS — K219 Gastro-esophageal reflux disease without esophagitis: Secondary | ICD-10-CM

## 2023-09-11 DIAGNOSIS — Z79899 Other long term (current) drug therapy: Secondary | ICD-10-CM | POA: Insufficient documentation

## 2023-09-11 DIAGNOSIS — A419 Sepsis, unspecified organism: Secondary | ICD-10-CM | POA: Diagnosis not present

## 2023-09-11 DIAGNOSIS — I4891 Unspecified atrial fibrillation: Secondary | ICD-10-CM | POA: Diagnosis not present

## 2023-09-11 DIAGNOSIS — S0990XA Unspecified injury of head, initial encounter: Secondary | ICD-10-CM | POA: Diagnosis not present

## 2023-09-11 DIAGNOSIS — R0689 Other abnormalities of breathing: Secondary | ICD-10-CM | POA: Diagnosis not present

## 2023-09-11 DIAGNOSIS — W19XXXA Unspecified fall, initial encounter: Secondary | ICD-10-CM | POA: Diagnosis not present

## 2023-09-11 DIAGNOSIS — I700 Atherosclerosis of aorta: Secondary | ICD-10-CM | POA: Diagnosis not present

## 2023-09-11 DIAGNOSIS — S2243XA Multiple fractures of ribs, bilateral, initial encounter for closed fracture: Secondary | ICD-10-CM | POA: Diagnosis not present

## 2023-09-11 DIAGNOSIS — G319 Degenerative disease of nervous system, unspecified: Secondary | ICD-10-CM | POA: Diagnosis not present

## 2023-09-11 DIAGNOSIS — K5730 Diverticulosis of large intestine without perforation or abscess without bleeding: Secondary | ICD-10-CM | POA: Diagnosis not present

## 2023-09-11 DIAGNOSIS — Z87891 Personal history of nicotine dependence: Secondary | ICD-10-CM | POA: Diagnosis not present

## 2023-09-11 DIAGNOSIS — I2510 Atherosclerotic heart disease of native coronary artery without angina pectoris: Secondary | ICD-10-CM | POA: Diagnosis not present

## 2023-09-11 LAB — RAPID URINE DRUG SCREEN, HOSP PERFORMED
Amphetamines: NOT DETECTED
Barbiturates: NOT DETECTED
Benzodiazepines: POSITIVE — AB
Cocaine: NOT DETECTED
Opiates: NOT DETECTED
Tetrahydrocannabinol: NOT DETECTED

## 2023-09-11 LAB — BLOOD GAS, VENOUS
Acid-base deficit: 0.5 mmol/L (ref 0.0–2.0)
Bicarbonate: 26.3 mmol/L (ref 20.0–28.0)
Drawn by: 1517
O2 Saturation: 48.6 %
Patient temperature: 36.4
pCO2, Ven: 50 mm[Hg] (ref 44–60)
pH, Ven: 7.33 (ref 7.25–7.43)
pO2, Ven: <31 mm[Hg] — CL (ref 32–45)

## 2023-09-11 LAB — CBC WITH DIFFERENTIAL/PLATELET
Abs Immature Granulocytes: 0.12 10*3/uL — ABNORMAL HIGH (ref 0.00–0.07)
Basophils Absolute: 0.1 10*3/uL (ref 0.0–0.1)
Basophils Relative: 1 %
Eosinophils Absolute: 0.2 10*3/uL (ref 0.0–0.5)
Eosinophils Relative: 2 %
HCT: 36.7 % — ABNORMAL LOW (ref 39.0–52.0)
Hemoglobin: 12.3 g/dL — ABNORMAL LOW (ref 13.0–17.0)
Immature Granulocytes: 1 %
Lymphocytes Relative: 8 %
Lymphs Abs: 0.7 10*3/uL (ref 0.7–4.0)
MCH: 34.4 pg — ABNORMAL HIGH (ref 26.0–34.0)
MCHC: 33.5 g/dL (ref 30.0–36.0)
MCV: 102.5 fL — ABNORMAL HIGH (ref 80.0–100.0)
Monocytes Absolute: 1.1 10*3/uL — ABNORMAL HIGH (ref 0.1–1.0)
Monocytes Relative: 13 %
Neutro Abs: 6.2 10*3/uL (ref 1.7–7.7)
Neutrophils Relative %: 75 %
Platelets: 172 10*3/uL (ref 150–400)
RBC: 3.58 MIL/uL — ABNORMAL LOW (ref 4.22–5.81)
RDW: 17.4 % — ABNORMAL HIGH (ref 11.5–15.5)
WBC: 8.3 10*3/uL (ref 4.0–10.5)
nRBC: 0 % (ref 0.0–0.2)

## 2023-09-11 LAB — URINALYSIS, W/ REFLEX TO CULTURE (INFECTION SUSPECTED)
Bacteria, UA: NONE SEEN
Bilirubin Urine: NEGATIVE
Glucose, UA: NEGATIVE mg/dL
Hgb urine dipstick: NEGATIVE
Ketones, ur: NEGATIVE mg/dL
Leukocytes,Ua: NEGATIVE
Nitrite: NEGATIVE
Protein, ur: 30 mg/dL — AB
Specific Gravity, Urine: 1.021 (ref 1.005–1.030)
pH: 6 (ref 5.0–8.0)

## 2023-09-11 LAB — COMPREHENSIVE METABOLIC PANEL
ALT: 22 U/L (ref 0–44)
AST: 54 U/L — ABNORMAL HIGH (ref 15–41)
Albumin: 3.5 g/dL (ref 3.5–5.0)
Alkaline Phosphatase: 76 U/L (ref 38–126)
Anion gap: 16 — ABNORMAL HIGH (ref 5–15)
BUN: 10 mg/dL (ref 8–23)
CO2: 22 mmol/L (ref 22–32)
Calcium: 8.9 mg/dL (ref 8.9–10.3)
Chloride: 97 mmol/L — ABNORMAL LOW (ref 98–111)
Creatinine, Ser: 1.06 mg/dL (ref 0.61–1.24)
GFR, Estimated: >60 mL/min (ref >60)
Glucose, Bld: 133 mg/dL — ABNORMAL HIGH (ref 70–99)
Potassium: 4 mmol/L (ref 3.5–5.1)
Sodium: 135 mmol/L (ref 135–145)
Total Bilirubin: 0.6 mg/dL (ref 0.0–1.2)
Total Protein: 7.2 g/dL (ref 6.5–8.1)

## 2023-09-11 LAB — ETHANOL: Alcohol, Ethyl (B): 176 mg/dL — ABNORMAL HIGH (ref <10)

## 2023-09-11 LAB — TROPONIN I (HIGH SENSITIVITY)
Troponin I (High Sensitivity): 17 ng/L (ref <18)
Troponin I (High Sensitivity): 54 ng/L — ABNORMAL HIGH (ref <18)
Troponin I (High Sensitivity): 84 ng/L — ABNORMAL HIGH (ref <18)
Troponin I (High Sensitivity): 65 ng/L — ABNORMAL HIGH (ref <18)

## 2023-09-11 LAB — TSH: TSH: 1.321 u[IU]/mL (ref 0.350–4.500)

## 2023-09-11 LAB — MAGNESIUM: Magnesium: 2.1 mg/dL (ref 1.7–2.4)

## 2023-09-11 LAB — LACTIC ACID, PLASMA
Lactic Acid, Venous: 4 mmol/L (ref 0.5–1.9)
Lactic Acid, Venous: 3.1 mmol/L (ref 0.5–1.9)

## 2023-09-11 LAB — AMMONIA: Ammonia: 29 umol/L (ref 9–35)

## 2023-09-11 LAB — CK: Total CK: 82 U/L (ref 49–397)

## 2023-09-11 MED ORDER — ONDANSETRON HCL 4 MG/2ML IJ SOLN: 4.0000 mg | Freq: Four times a day (QID) | INTRAMUSCULAR | Status: DC | PRN

## 2023-09-11 MED ORDER — ADULT MULTIVITAMIN W/MINERALS CH
1.0000 | ORAL_TABLET | Freq: Every day | ORAL | Status: DC
  Administered 2023-09-11 – 2023-09-12 (×2): 1 via ORAL
  Filled 2023-09-11 (×2): qty 1

## 2023-09-11 MED ORDER — LACTATED RINGERS IV BOLUS (SEPSIS)
1000.0000 mL | Freq: Once | INTRAVENOUS | Status: AC
  Administered 2023-09-11: 1000 mL via INTRAVENOUS

## 2023-09-11 MED ORDER — ONDANSETRON HCL 4 MG PO TABS: 4.0000 mg | ORAL_TABLET | Freq: Four times a day (QID) | ORAL | Status: DC | PRN

## 2023-09-11 MED ORDER — SODIUM CHLORIDE 0.9% FLUSH: 3.0000 mL | INTRAVENOUS | Status: DC | PRN

## 2023-09-11 MED ORDER — ACETAMINOPHEN 325 MG PO TABS: 650.0000 mg | ORAL_TABLET | Freq: Four times a day (QID) | ORAL | Status: DC | PRN

## 2023-09-11 MED ORDER — HEPARIN BOLUS VIA INFUSION
4700.0000 [IU] | Freq: Once | INTRAVENOUS | Status: AC
  Administered 2023-09-11: 4700 [IU] via INTRAVENOUS

## 2023-09-11 MED ORDER — ENOXAPARIN SODIUM 40 MG/0.4ML IJ SOSY
40.0000 mg | PREFILLED_SYRINGE | INTRAMUSCULAR | Status: DC
  Administered 2023-09-11: 40 mg via SUBCUTANEOUS
  Filled 2023-09-11: qty 0.4

## 2023-09-11 MED ORDER — IOHEXOL 350 MG/ML SOLN
100.0000 mL | Freq: Once | INTRAVENOUS | Status: AC | PRN
  Administered 2023-09-11: 100 mL via INTRAVENOUS

## 2023-09-11 MED ORDER — FOLIC ACID 1 MG PO TABS
1.0000 mg | ORAL_TABLET | Freq: Every day | ORAL | Status: DC
  Administered 2023-09-11 – 2023-09-12 (×2): 1 mg via ORAL
  Filled 2023-09-11 (×2): qty 1

## 2023-09-11 MED ORDER — THIAMINE MONONITRATE 100 MG PO TABS
100.0000 mg | ORAL_TABLET | Freq: Every day | ORAL | Status: DC
  Administered 2023-09-11 – 2023-09-12 (×2): 100 mg via ORAL
  Filled 2023-09-11 (×2): qty 1

## 2023-09-11 MED ORDER — DILTIAZEM HCL-DEXTROSE 125-5 MG/125ML-% IV SOLN (PREMIX)
5.0000 mg/h | INTRAVENOUS | Status: DC
  Administered 2023-09-11: 5 mg/h via INTRAVENOUS
  Filled 2023-09-11: qty 125

## 2023-09-11 MED ORDER — LORAZEPAM 1 MG PO TABS
1.0000 mg | ORAL_TABLET | ORAL | Status: DC | PRN
Start: 2023-09-11 — End: 2023-09-14

## 2023-09-11 MED ORDER — OCUVITE-LUTEIN PO CAPS
1.0000 | ORAL_CAPSULE | Freq: Every day | ORAL | Status: DC
  Administered 2023-09-12: 1 via ORAL
  Filled 2023-09-11: qty 1

## 2023-09-11 MED ORDER — MOMETASONE FURO-FORMOTEROL FUM 200-5 MCG/ACT IN AERO
2.0000 | INHALATION_SPRAY | Freq: Two times a day (BID) | RESPIRATORY_TRACT | Status: DC
  Administered 2023-09-11 – 2023-09-12 (×2): 2 via RESPIRATORY_TRACT
  Filled 2023-09-11 (×2): qty 8.8

## 2023-09-11 MED ORDER — ACETAMINOPHEN 650 MG RE SUPP: 650.0000 mg | Freq: Four times a day (QID) | RECTAL | Status: DC | PRN

## 2023-09-11 MED ORDER — DILTIAZEM LOAD VIA INFUSION
10.0000 mg | Freq: Once | INTRAVENOUS | Status: AC
  Administered 2023-09-11: 10 mg via INTRAVENOUS
  Filled 2023-09-11: qty 10

## 2023-09-11 MED ORDER — SODIUM CHLORIDE 0.9% FLUSH
3.0000 mL | Freq: Two times a day (BID) | INTRAVENOUS | Status: DC
  Administered 2023-09-11 – 2023-09-12 (×3): 3 mL via INTRAVENOUS

## 2023-09-11 MED ORDER — SODIUM CHLORIDE 0.9 % IV SOLN: 250.0000 mL | INTRAVENOUS | Status: AC | PRN

## 2023-09-11 MED ORDER — HEPARIN (PORCINE) 25000 UT/250ML-% IV SOLN
1150.0000 [IU]/h | INTRAVENOUS | Status: DC
  Administered 2023-09-11: 1150 [IU]/h via INTRAVENOUS
  Filled 2023-09-11: qty 250

## 2023-09-11 MED ORDER — ASPIRIN 81 MG PO CHEW
324.0000 mg | CHEWABLE_TABLET | Freq: Once | ORAL | Status: AC
  Administered 2023-09-11: 324 mg via ORAL
  Filled 2023-09-11: qty 4

## 2023-09-11 MED ORDER — PANTOPRAZOLE SODIUM 40 MG PO TBEC
40.0000 mg | DELAYED_RELEASE_TABLET | Freq: Every day | ORAL | Status: DC
  Administered 2023-09-11 – 2023-09-12 (×2): 40 mg via ORAL
  Filled 2023-09-11 (×2): qty 1

## 2023-09-11 MED ORDER — LORAZEPAM 2 MG/ML IJ SOLN: 1.0000 mg | INTRAMUSCULAR | Status: DC | PRN

## 2023-09-11 MED ORDER — THIAMINE HCL 100 MG/ML IJ SOLN
100.0000 mg | Freq: Every day | INTRAMUSCULAR | Status: DC
  Filled 2023-09-11: qty 2

## 2023-09-11 MED ORDER — IPRATROPIUM-ALBUTEROL 0.5-2.5 (3) MG/3ML IN SOLN: 3.0000 mL | Freq: Four times a day (QID) | RESPIRATORY_TRACT | Status: DC | PRN

## 2023-09-11 NOTE — Progress Notes (Signed)
PHARMACY - ANTICOAGULATION CONSULT NOTE  Pharmacy Consult for heparin Indication: atrial fibrillation  No Known Allergies  Patient Measurements: Height: 5\' 8"  (172.7 cm) Weight: 79 kg (174 lb 2.6 oz) IBW/kg (Calculated) : 68.4 Heparin Dosing Weight: TBW  Vital Signs: Temp: 97.6 F (36.4 C) (02/02 0141) Temp Source: Oral (02/02 0141) BP: 104/64 (02/02 0341) Pulse Rate: 137 (02/02 0341)  Labs: Recent Labs    09/11/23 0205  HGB 12.3*  HCT 36.7*  PLT 172  CREATININE 1.06  TROPONINIHS 17    Estimated Creatinine Clearance: 56.5 mL/min (by C-G formula based on SCr of 1.06 mg/dL).  Medications:  -No PTA anticoagulation reported   Assessment: 73 yoM presented to ED with altered mental status. Pharmacy consulted to dose heparin for atrial fibrillation.  -Hgb 12, plts 172  Goal of Therapy:  Heparin level 0.3-0.7 units/ml Monitor platelets by anticoagulation protocol: Yes   Plan:  Give 4700 units bolus x 1 Start heparin infusion at 1150 units/hr Check anti-Xa level in 8 hours and daily while on heparin Continue to monitor H&H and platelets  Arabella Merles, PharmD. Clinical Pharmacist 09/11/2023 4:03 AM

## 2023-09-11 NOTE — Plan of Care (Signed)

## 2023-09-11 NOTE — Plan of Care (Signed)
  Problem: Acute Rehab PT Goals(only PT should resolve) Goal: Patient Will Transfer Sit To/From Stand Flowsheets (Taken 09/11/2023 1213) Patient will transfer sit to/from stand: Independently Goal: Pt Will Perform Standing Balance Or Pre-Gait Flowsheets (Taken 09/11/2023 1213) Pt will perform standing balance or pre-gait: Independently Goal: Pt Will Ambulate Flowsheets (Taken 09/11/2023 1213) Pt will Ambulate:  > 125 feet  with modified independence  with least restrictive assistive device  Nelida Meuse PT, DPT [facility] [place] 336 [phone] office

## 2023-09-11 NOTE — ED Triage Notes (Signed)
Pt bib EMS after neighbors did a well check and found pt unresponsive on kitchen floor. EMS reports that there were numerous empty wine bottles scattered around residence. EMS also states that pt's pupils were constricted upon their arrival and that pt's O2 sats on R/A were 80%. EMS administered 4mg  Narcan on scene and pt then became responsive. Pt was placed on O2 @ 2L [place] en route and sats improved to 98%.

## 2023-09-11 NOTE — Care Management CC44 (Signed)
Condition Code 44 Documentation Completed  Patient Details  Name: Vincent Gaines MRN: [id number] Date of Birth: Mar 20, 1946   Condition Code 44 given:  Yes Patient signature on Condition Code 44 notice:  Yes Documentation of 2 MD's agreement:  Yes Code 44 added to claim:  Yes  I, Karn Cassis, LCSW, verbally reviewed observation notice.  09/11/2023, 4:11 PM   Karn Cassis, LCSW 09/11/2023, 4:11 PM

## 2023-09-11 NOTE — Evaluation (Signed)
Physical Therapy Evaluation Patient Details Name: Vincent Gaines MRN: [id number] DOB: [birth year] Today's Date: 09/11/2023  History of Present Illness  a 78 y.o. male with medical history significant of COPD, GERD, hypertension, hypercholesterolemia, anxiety and alcohol abuse/dependency; who presented to the hospital after he was found on his kitchen floor by the neighbors obtunded.  EMS was activated, patient with pinpoint pupils, altered mental status and mildly hypoxic; he received treatment with Narcan, oxygen supplementation and was transported to emergency department.  Clinical Impression   Pt tolerated today's Physical Therapy Evaluation, well with decent carryover for mobility. @ baseline pt independent with mobility with and without AD intermittently.  Pt demonstrating today, mild limitations in ambulation, transfers due to muscle weakness and balance deficits. Based upon these deficits/impairments, patient will benefit from continued skilled physical therapy services during remainder of hospital stay and at the next recommended venue of care to address deficits and promote return to optimal function.                If plan is discharge home, recommend the following: A little help with walking and/or transfers;A little help with bathing/dressing/bathroom   Can travel by private vehicle        Equipment Recommendations    Recommendations for Other Services       Functional Status Assessment Patient has had a recent decline in their functional status and demonstrates the ability to make significant improvements in function in a reasonable and predictable amount of time.     Precautions / Restrictions        Mobility  Bed Mobility Overal bed mobility: Independent               Patient Response: Cooperative  Transfers Overall transfer level: Needs assistance   Transfers: Sit to/from Stand Sit to Stand: Contact guard assist           General transfer  comment: Stabilizing of RW for assist    Ambulation/Gait Ambulation/Gait assistance: Modified independent (Device/Increase time), Contact guard assist Gait Distance (Feet): 45 Feet Assistive device: Rolling walker (2 wheels) Gait Pattern/deviations: WFL(Within Functional Limits), Shuffle       General Gait Details: slow, guarded gait with RW at Mod I. CGA level with 66ft of ambulation without AD.  Stairs            Wheelchair Mobility     Tilt Bed Tilt Bed Patient Response: Cooperative  Modified Rankin (Stroke Patients Only)       Balance Overall balance assessment: Needs assistance Sitting-balance support: No upper extremity supported, Feet supported Sitting balance-Leahy Scale: Good Sitting balance - Comments: sitting EOB   Standing balance support: During functional activity, Bilateral upper extremity supported Standing balance-Leahy Scale: Good Standing balance comment: fair/good with RW and without AD.                             Pertinent Vitals/Pain Pain Assessment Pain Assessment: No/denies pain    Home Living Family/patient expects to be discharged to:: Private residence Living Arrangements: Alone Available Help at Discharge: Friend(s);Available PRN/intermittently Type of Home: House Home Access: Stairs to enter Entrance Stairs-Rails: Right;Left;Can reach both Entrance Stairs-Number of Steps: 3   Home Layout: One level Home Equipment: Agricultural consultant (2 wheels);Cane - quad      Prior Function Prior Level of Function : Independent/Modified Independent;Driving             Mobility Comments: limited community ambulator- using RW at  home level. ADLs Comments: mod I with ADLs, daugther occasionally lives and assist with iADLs.     Extremity/Trunk Assessment   Upper Extremity Assessment Upper Extremity Assessment: Overall WFL for tasks assessed    Lower Extremity Assessment Lower Extremity Assessment: Generalized weakness     Cervical / Trunk Assessment Cervical / Trunk Assessment: Kyphotic  Communication   Communication Communication: No apparent difficulties  Cognition Arousal: Alert Behavior During Therapy: WFL for tasks assessed/performed Overall Cognitive Status: Within Functional Limits for tasks assessed                                          General Comments      Exercises     Assessment/Plan    PT Assessment Patient needs continued PT services  PT Problem List Decreased strength;Decreased activity tolerance;Decreased balance;Decreased mobility       PT Treatment Interventions DME instruction;Gait training;Stair training;Functional mobility training;Therapeutic activities;Therapeutic exercise;Balance training;Neuromuscular re-education    PT Goals (Current goals can be found in the Care Plan section)  Acute Rehab PT Goals Patient Stated Goal: return home PT Goal Formulation: With patient Time For Goal Achievement: 09/25/23 Potential to Achieve Goals: Good    Frequency Min 2X/week     Co-evaluation               AM-PAC PT "6 Clicks" Mobility  Outcome Measure Help needed turning from your back to your side while in a flat bed without using bedrails?: None Help needed moving from lying on your back to sitting on the side of a flat bed without using bedrails?: None Help needed moving to and from a bed to a chair (including a wheelchair)?: None Help needed standing up from a chair using your arms (e.g., wheelchair or bedside chair)?: A Little Help needed to walk in hospital room?: A Little Help needed climbing 3-5 steps with a railing? : A Lot 6 Click Score: 20    End of Session Equipment Utilized During Treatment: Gait belt Activity Tolerance: Patient tolerated treatment well Patient left: in bed;with bed alarm set;with call bell/phone within reach Nurse Communication: Mobility status PT Visit Diagnosis: Unsteadiness on feet (R26.81);Muscle weakness  (generalized) (M62.81)    Time: 5573-2202 PT Time Calculation (min) (ACUTE ONLY): 10 min   Charges:   PT Evaluation $PT Eval Low Complexity: 1 Low   PT General Charges $$ ACUTE PT VISIT: 1 Visit         Elie Goody, DPT [facility] [place] 336 [phone] office  Nelida Meuse 09/11/2023, 12:10 PM

## 2023-09-11 NOTE — ED Notes (Signed)
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 0333)  diltiazem (CARDIZEM) 1 mg/mL load via infusion 10 mg (10 mg Intravenous Bolus from Bag 09/11/23 0340)  iohexol (OMNIPAQUE) 350 MG/ML injection 100 mL (100 mLs Intravenous Contrast Given 09/11/23 0301)  heparin bolus via infusion 4,700 Units (4,700 Units Intravenous Bolus from Bag 09/11/23 0418)  aspirin chewable tablet 324 mg (324 mg Oral Given 09/11/23 0553)    Mobility walks with person assist     Focused Assessments Cardiac Assessment Handoff:  Cardiac Rhythm: Atrial fibrillation Lab Results  Component Value Date   CKTOTAL 82 09/11/2023   Lab Results  Component Value Date   DDIMER 0.46 01/28/2022   Does the Patient currently have chest pain? No    R Recommendations: See Admitting Provider Note  Report given to:   Additional Notes:  Patient been using urinal.

## 2023-09-11 NOTE — ED Notes (Signed)
Pt converted to sinus rhythm at 0550. EKG captured and given to [facility], EDP. Verbalized continuing cardizem at this time.
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
## 2023-09-11 NOTE — Progress Notes (Signed)
Read SBAR

## 2023-09-11 NOTE — Care Management Obs Status (Signed)
MEDICARE OBSERVATION STATUS NOTIFICATION   Patient Details  Name: Vincent Gaines MRN: [id number] Date of Birth: 1945/09/17   Medicare Observation Status Notification Given:  Yes  IKarn Cassis, LCSW, verbally reviewed observation notice.  09/11/2023, 4:11 PM   Karn Cassis, LCSW 09/11/2023, 4:11 PM

## 2023-09-11 NOTE — ED Provider Notes (Addendum)
[place] EMERGENCY DEPARTMENT AT [facility] Provider Note   CSN: [id number] Arrival date & time: 09/11/23  0132     History  Chief Complaint  Patient presents with   Altered Mental Status    Vincent Gaines is a 78 y.o. male.   Altered Mental Status Patient presents for altered mental status.  This evening, he was found on kitchen floor by neighbors.  EMS was called to the scene.  Patient was awake but confused.  They noted pinpoint pupils and hypoxia.  He was given 1 mg of intranasal Narcan.  This improved his responsiveness and SpO2 was subsequently normal on room air.  Other vital signs prior to arrival notable for tachycardia and tachypnea.  Patient currently denies any areas of discomfort.  Per EMS, he did endorse alcohol use earlier today.  They noted multiple empty wine bottles at place of residence.     Home Medications Prior to Admission medications   Medication Sig Start Date End Date Taking? Authorizing Provider  albuterol (VENTOLIN HFA) 108 (90 Base) MCG/ACT inhaler Inhale 2 puffs into the lungs every 6 (six) hours as needed for wheezing or shortness of breath. 08/26/23   Tyrone Nine, MD  ALPRAZolam Prudy Feeler) 1 MG tablet Take 1 mg by mouth 3 (three) times daily as needed for anxiety or sleep. Take    [provider]  Cyanocobalamin (VITAMIN B-12 PO) Take 1 tablet by mouth daily.    [provider]  guaiFENesin (ROBITUSSIN) 100 MG/5ML liquid Take 10 mLs by mouth at bedtime. Liquid mucinex d/t being out of mucinex tabs    [provider]  lactose free nutrition (BOOST) LIQD Take 237 mLs by mouth daily.    [provider]  losartan (COZAAR) 50 MG tablet Take 1 tablet (50 mg total) by mouth daily. 08/26/23 08/25/24  Tyrone Nine, MD  mometasone-formoterol (DULERA) 200-5 MCG/ACT AERO Inhale 2 puffs into the lungs 2 (two) times daily. 08/26/23   Tyrone Nine, MD  Multiple Vitamins-Minerals (PRESERVISION AREDS 2) CAPS Take 1  capsule by mouth daily.    [provider]  Polyvinyl Alcohol (LIQUID TEARS OP) Apply 1 drop to eye daily with breakfast.    [provider]  simvastatin (ZOCOR) 40 MG tablet Take 40 mg by mouth daily with breakfast.    [provider]  tadalafil (CIALIS) 20 MG tablet Take 20 mg by mouth daily. 05/19/23   [provider]      Allergies    Patient has no known allergies.    Review of Systems   Review of Systems  Unable to perform ROS: Mental status change    Physical Exam Updated Vital Signs BP 104/64   Pulse (!) 137   Temp 97.6 F (36.4 C) (Oral)   Resp (!) 33   Ht 5\' 8"  (1.727 m)   Wt 79 kg   SpO2 92%   BMI 26.48 kg/m  Physical Exam Vitals and nursing note reviewed.  Constitutional:      General: He is not in acute distress.    Appearance: Normal appearance. He is well-developed. He is not ill-appearing, toxic-appearing or diaphoretic.  HENT:     Head: Normocephalic and atraumatic.     Right Ear: External ear normal.     Left Ear: External ear normal.     Nose: Nose normal.     Mouth/Throat:     Mouth: Mucous membranes are moist.  Eyes:     Extraocular Movements: Extraocular  movements intact.     Conjunctiva/sclera: Conjunctivae normal.  Cardiovascular:     Rate and Rhythm: Normal rate and regular rhythm.  Pulmonary:     Effort: Pulmonary effort is normal. No respiratory distress.  Abdominal:     General: There is no distension.     Palpations: Abdomen is soft.     Tenderness: There is no abdominal tenderness.  Musculoskeletal:        General: No swelling or deformity. Normal range of motion.     Cervical back: Normal range of motion and neck supple.  Skin:    General: Skin is warm and dry.     Coloration: Skin is not jaundiced or pale.  Neurological:     General: No focal deficit present.     Mental Status: He is alert. He is disoriented.  Psychiatric:        Mood and Affect: Mood and affect normal.        Speech:  Speech is slurred.        Behavior: Behavior normal. Behavior is cooperative.     ED Results / Procedures / Treatments   Labs (all labs ordered are listed, but only abnormal results are displayed) Labs Reviewed  LACTIC ACID, PLASMA - Abnormal; Notable for the following components:      Result Value   Lactic Acid, Venous 4.0 (*)    All other components within normal limits  COMPREHENSIVE METABOLIC PANEL - Abnormal; Notable for the following components:   Chloride 97 (*)    Glucose, Bld 133 (*)    AST 54 (*)    Anion gap 16 (*)    All other components within normal limits  CBC WITH DIFFERENTIAL/PLATELET - Abnormal; Notable for the following components:   RBC 3.58 (*)    Hemoglobin 12.3 (*)    HCT 36.7 (*)    MCV 102.5 (*)    MCH 34.4 (*)    RDW 17.4 (*)    Monocytes Absolute 1.1 (*)    Abs Immature Granulocytes 0.12 (*)    All other components within normal limits  ETHANOL - Abnormal; Notable for the following components:   Alcohol, Ethyl (B) 176 (*)    All other components within normal limits  CULTURE, BLOOD (ROUTINE X 2)  CULTURE, BLOOD (ROUTINE X 2)  MAGNESIUM  AMMONIA  LACTIC ACID, PLASMA  URINALYSIS, W/ REFLEX TO CULTURE (INFECTION SUSPECTED)  BLOOD GAS, VENOUS  RAPID URINE DRUG SCREEN, HOSP PERFORMED  I-STAT CHEM 8, ED  TROPONIN I (HIGH SENSITIVITY)  TROPONIN I (HIGH SENSITIVITY)    EKG None  Radiology CT Angio Chest PE W and/or Wo Contrast Result Date: 09/11/2023 CLINICAL DATA:  [name] did a well check-in found patient unresponsive on kitchen floor. Numerous empty 1 bottle scattered around residents. Low oxygen saturations. Patient unresponsive after Narcan. EXAM: CT ANGIOGRAPHY CHEST CT ABDOMEN AND PELVIS WITH CONTRAST TECHNIQUE: Multidetector CT imaging of the chest was performed using the standard protocol during bolus administration of intravenous contrast. Multiplanar CT image reconstructions and MIPs were obtained to evaluate the vascular anatomy.  Multidetector CT imaging of the abdomen and pelvis was performed using the standard protocol during bolus administration of intravenous contrast. RADIATION DOSE REDUCTION: This exam was performed according to the departmental dose-optimization program which includes automated exposure control, adjustment of the mA and/or kV according to patient size and/or use of iterative reconstruction technique. CONTRAST:  OMNIPAQUE IOHEXOL 350 MG/ML SOLN COMPARISON:  Same day chest radiograph; CT chest abdomen and pelvis 01/28/2022  FINDINGS: CTA CHEST FINDINGS Cardiovascular: Negative for acute pulmonary embolism. Coronary artery and aortic atherosclerotic calcification. No pericardial effusion. Mediastinum/Nodes: Trachea and esophagus are unremarkable. No thoracic adenopathy. Lungs/Pleura: Peripheral subpleural reticular and ground-glass opacities likely due to chronic scarring. Bibasilar atelectasis. No focal pneumonia, pleural effusion, or pneumothorax. Musculoskeletal: No acute fracture. Remote bilateral rib fractures. Chronic compression fracture of T12. Symmetric gynecomastia. Review of the MIP images confirms the above findings. CT ABDOMEN and PELVIS FINDINGS Hepatobiliary: Hepatic steatosis. Gallbladder and biliary tree are unremarkable. Pancreas: Unremarkable. Spleen: Spleen is not distinctly visualized. Adrenals/Urinary Tract: Normal adrenal glands. No urinary calculi or hydronephrosis. Unremarkable bladder. Stomach/Bowel: Normal caliber large and small bowel. Stomach is within normal limits. No bowel wall thickening. Normal appendix. Extensive sigmoid diverticulosis without evidence of diverticulitis. Vascular/Lymphatic: Aortic atherosclerosis. No enlarged abdominal or pelvic lymph nodes. Reproductive: Unremarkable. Other: No free intraperitoneal fluid or air. Musculoskeletal: No acute fracture. Review of the MIP images confirms the above findings. IMPRESSION: 1. Negative for acute pulmonary embolism. 2. No  acute abnormality in the chest, abdomen, or pelvis. 3. Hepatic steatosis. 4.  Aortic Atherosclerosis (ICD10-I70.0). Electronically Signed   By: Minerva Fester M.D.   On: 09/11/2023 03:40   CT ABDOMEN PELVIS W CONTRAST Result Date: 09/11/2023 CLINICAL DATA:  [name] did a well check-in found patient unresponsive on kitchen floor. Numerous empty 1 bottle scattered around residents. Low oxygen saturations. Patient unresponsive after Narcan. EXAM: CT ANGIOGRAPHY CHEST CT ABDOMEN AND PELVIS WITH CONTRAST TECHNIQUE: Multidetector CT imaging of the chest was performed using the standard protocol during bolus administration of intravenous contrast. Multiplanar CT image reconstructions and MIPs were obtained to evaluate the vascular anatomy. Multidetector CT imaging of the abdomen and pelvis was performed using the standard protocol during bolus administration of intravenous contrast. RADIATION DOSE REDUCTION: This exam was performed according to the departmental dose-optimization program which includes automated exposure control, adjustment of the mA and/or kV according to patient size and/or use of iterative reconstruction technique. CONTRAST:  OMNIPAQUE IOHEXOL 350 MG/ML SOLN COMPARISON:  Same day chest radiograph; CT chest abdomen and pelvis 01/28/2022 FINDINGS: CTA CHEST FINDINGS Cardiovascular: Negative for acute pulmonary embolism. Coronary artery and aortic atherosclerotic calcification. No pericardial effusion. Mediastinum/Nodes: Trachea and esophagus are unremarkable. No thoracic adenopathy. Lungs/Pleura: Peripheral subpleural reticular and ground-glass opacities likely due to chronic scarring. Bibasilar atelectasis. No focal pneumonia, pleural effusion, or pneumothorax. Musculoskeletal: No acute fracture. Remote bilateral rib fractures. Chronic compression fracture of T12. Symmetric gynecomastia. Review of the MIP images confirms the above findings. CT ABDOMEN and PELVIS FINDINGS Hepatobiliary: Hepatic  steatosis. Gallbladder and biliary tree are unremarkable. Pancreas: Unremarkable. Spleen: Spleen is not distinctly visualized. Adrenals/Urinary Tract: Normal adrenal glands. No urinary calculi or hydronephrosis. Unremarkable bladder. Stomach/Bowel: Normal caliber large and small bowel. Stomach is within normal limits. No bowel wall thickening. Normal appendix. Extensive sigmoid diverticulosis without evidence of diverticulitis. Vascular/Lymphatic: Aortic atherosclerosis. No enlarged abdominal or pelvic lymph nodes. Reproductive: Unremarkable. Other: No free intraperitoneal fluid or air. Musculoskeletal: No acute fracture. Review of the MIP images confirms the above findings. IMPRESSION: 1. Negative for acute pulmonary embolism. 2. No acute abnormality in the chest, abdomen, or pelvis. 3. Hepatic steatosis. 4.  Aortic Atherosclerosis (ICD10-I70.0). Electronically Signed   By: Minerva Fester M.D.   On: 09/11/2023 03:40   CT HEAD WO CONTRAST Result Date: 09/11/2023 CLINICAL DATA:  Head trauma, moderate-severe; Neck trauma, intoxicated or obtunded (Age >= 16y) EXAM: CT HEAD WITHOUT CONTRAST CT CERVICAL SPINE WITHOUT CONTRAST TECHNIQUE: Multidetector CT imaging of  the head and cervical spine was performed following the standard protocol without intravenous contrast. Multiplanar CT image reconstructions of the cervical spine were also generated. RADIATION DOSE REDUCTION: This exam was performed according to the departmental dose-optimization program which includes automated exposure control, adjustment of the mA and/or kV according to patient size and/or use of iterative reconstruction technique. COMPARISON:  CT head January 28, 2022.  CTA chest January 28, 2022. FINDINGS: CT HEAD FINDINGS Brain: No evidence of acute infarction, hemorrhage, hydrocephalus, extra-axial collection or mass lesion/mass effect. Cerebral atrophy. Vascular: Calcific atherosclerosis. Skull: No acute fracture. Sinuses/Orbits: Moderate paranasal sinus  mucosal thickening. No acute orbital findings. Other: No mastoid effusions. CT CERVICAL SPINE FINDINGS Alignment: No substantial sagittal subluxation. Skull base and vertebrae: Mild height loss at C5 and T1, similar to CT of the chest from 2023. New height loss. No evidence of acute fracture. Posterior cerclage wire fixation at C3 to C6. Soft tissues and spinal canal: No prevertebral fluid or swelling. No visible canal hematoma. Disc levels:  Moderate multilevel degenerative change. Upper chest: Visualized lung apices are clear. IMPRESSION: No evidence of acute abnormality intracranially or in the cervical spine. Electronically Signed   By: Feliberto Harts M.D.   On: 09/11/2023 03:33   CT CERVICAL SPINE WO CONTRAST Result Date: 09/11/2023 CLINICAL DATA:  Head trauma, moderate-severe; Neck trauma, intoxicated or obtunded (Age >= 16y) EXAM: CT HEAD WITHOUT CONTRAST CT CERVICAL SPINE WITHOUT CONTRAST TECHNIQUE: Multidetector CT imaging of the head and cervical spine was performed following the standard protocol without intravenous contrast. Multiplanar CT image reconstructions of the cervical spine were also generated. RADIATION DOSE REDUCTION: This exam was performed according to the departmental dose-optimization program which includes automated exposure control, adjustment of the mA and/or kV according to patient size and/or use of iterative reconstruction technique. COMPARISON:  CT head January 28, 2022.  CTA chest January 28, 2022. FINDINGS: CT HEAD FINDINGS Brain: No evidence of acute infarction, hemorrhage, hydrocephalus, extra-axial collection or mass lesion/mass effect. Cerebral atrophy. Vascular: Calcific atherosclerosis. Skull: No acute fracture. Sinuses/Orbits: Moderate paranasal sinus mucosal thickening. No acute orbital findings. Other: No mastoid effusions. CT CERVICAL SPINE FINDINGS Alignment: No substantial sagittal subluxation. Skull base and vertebrae: Mild height loss at C5 and T1, similar to CT of  the chest from 2023. New height loss. No evidence of acute fracture. Posterior cerclage wire fixation at C3 to C6. Soft tissues and spinal canal: No prevertebral fluid or swelling. No visible canal hematoma. Disc levels:  Moderate multilevel degenerative change. Upper chest: Visualized lung apices are clear. IMPRESSION: No evidence of acute abnormality intracranially or in the cervical spine. Electronically Signed   By: Feliberto Harts M.D.   On: 09/11/2023 03:33   DG Chest Port 1 View Result Date: 09/11/2023 CLINICAL DATA:  Sepsis EXAM: PORTABLE CHEST 1 VIEW COMPARISON:  08/25/2023 FINDINGS: The heart size and mediastinal contours are within normal limits. Both lungs are clear. The visualized skeletal structures are unremarkable. Bibasilar atelectasis. IMPRESSION: Bibasilar atelectasis. Electronically Signed   By: Deatra Robinson M.D.   On: 09/11/2023 02:58    Procedures Procedures    Medications Ordered in ED Medications  diltiazem (CARDIZEM) 1 mg/mL load via infusion 10 mg (10 mg Intravenous Bolus from Bag 09/11/23 0340)    And  diltiazem (CARDIZEM) 125 mg in dextrose 5% 125 mL (1 mg/mL) infusion (5 mg/hr Intravenous New Bag/Given 09/11/23 0340)  LORazepam (ATIVAN) tablet 1-4 mg (has no administration in time range)    Or  LORazepam (ATIVAN) injection  1-4 mg (has no administration in time range)  thiamine (VITAMIN B1) tablet 100 mg (has no administration in time range)    Or  thiamine (VITAMIN B1) injection 100 mg (has no administration in time range)  folic acid (FOLVITE) tablet 1 mg (has no administration in time range)  multivitamin with minerals tablet 1 tablet (has no administration in time range)  lactated ringers bolus 1,000 mL (0 mLs Intravenous Stopped 09/11/23 0333)  iohexol (OMNIPAQUE) 350 MG/ML injection 100 mL (100 mLs Intravenous Contrast Given 09/11/23 0301)    ED Course/ Medical Decision Making/ A&P                                 Medical Decision Making Amount and/or  Complexity of Data Reviewed Labs: ordered. Radiology: ordered.  Risk OTC drugs. Prescription drug management. Decision regarding hospitalization.   This patient presents to the ED for concern of altered mental status, this involves an extensive number of treatment options, and is a complaint that carries with it a high risk of complications and morbidity.  The differential diagnosis includes intoxication, head injury, CVA, metabolic derangements   Co morbidities that complicate the patient evaluation  Alcohol abuse, COPD, anemia, anxiety, HLD, hyponatremia   Additional history obtained:  Additional history obtained from EMS, patient's son External records from outside source obtained and reviewed including EMR   Lab Tests:  I Ordered, and personally interpreted labs.  The pertinent results include: Baseline anemia, no leukocytosis, normal kidney function, normal electrolytes, normal troponin.  Alcohol level is elevated.  Lactate is elevated, likely secondary to his chronic alcohol use.   Imaging Studies ordered:  I ordered imaging studies including chest x-ray, CT of head, cervical spine; CTA chest, CT of abdomen and pelvis I independently visualized and interpreted imaging which showed no acute findings I agree with the radiologist interpretation   Cardiac Monitoring: / EKG:  The patient was maintained on a cardiac monitor.  I personally viewed and interpreted the cardiac monitored which showed an underlying rhythm of: Atrial fibrillation   Problem List / ED Course / Critical interventions / Medication management  Patient presents via EMS from home for altered mental status, witnessed by neighbor when they found him on the kitchen floor.  On arrival, he does appear intoxicated.  There may be narcotic use as well as EMS noted positive response to Narcan prior to arrival.  Vital signs on arrival notable for tachycardia and tachypnea.  IV fluids were ordered.  Workup was  initiated.  Per chart review, he was recently admitted 2 weeks ago for hyponatremia.  Patient was placed on bedside cardiac monitor.  He was found to be in atrial fibrillation with RVR.  He does not have a history of this.  Diltiazem was ordered for rate control.  Heparin was held pending CT images to ensure no bleeding, given that he likely had a fall earlier today.  Fortunately, patient's electrolytes were normal today.  He was found to have elevated alcohol level consistent with history provided by EMS.  Lactate was elevated.  This may be secondary to NADH mismatch from his chronic alcoholism.  Alternatively, he may have had a seizure episode.  On reassessment, patient resting comfortably.  His son is at bedside.  Patient is able to describe the events of the day.  He states that he got drunk while watching college basketball.  Son reports that, other than current intoxication, patient seems  to be in his normal state.  Patient denies any history of severe withdrawal from alcohol.  CIWA was ordered out of precaution.  Given his new A-fib, patient to be admitted for further management.  At approximately 5:30 AM, patient had conversion to normal sinus rhythm:  I ordered medication including IV fluids for hydration; Cardizem for rate control Reevaluation of the patient after these medicines showed that the patient improved I have reviewed the patients home medicines and have made adjustments as needed   Social Determinants of Health:  Lives independently, ongoing alcohol abuse  CRITICAL CARE Performed by: Gloris Manchester   Total critical care time: 34 minutes  Critical care time was exclusive of separately billable procedures and treating other patients.  Critical care was necessary to treat or prevent imminent or life-threatening deterioration.  Critical care was time spent personally by me on the following activities: development of treatment plan with patient and/or surrogate as well as nursing,  discussions with consultants, evaluation of patient's response to treatment, examination of patient, obtaining history from patient or surrogate, ordering and performing treatments and interventions, ordering and review of laboratory studies, ordering and review of radiographic studies, pulse oximetry and re-evaluation of patient's condition.         Final Clinical Impression(s) / ED Diagnoses Final diagnoses:  Alcoholic intoxication without complication (HCC)  Atrial fibrillation with RVR [facility])    Rx / DC Orders ED Discharge Orders          Ordered    Amb referral to AFIB Clinic        09/11/23 0250              Gloris Manchester, MD 09/11/23 Enid Cutter    Gloris Manchester, MD 09/11/23 1478    Gloris Manchester, MD 09/11/23 [phone]

## 2023-09-12 ENCOUNTER — Observation Stay (HOSPITAL_BASED_OUTPATIENT_CLINIC_OR_DEPARTMENT_OTHER): Payer: Medicare Other

## 2023-09-12 DIAGNOSIS — K219 Gastro-esophageal reflux disease without esophagitis: Secondary | ICD-10-CM | POA: Diagnosis not present

## 2023-09-12 DIAGNOSIS — R7989 Other specified abnormal findings of blood chemistry: Secondary | ICD-10-CM | POA: Diagnosis not present

## 2023-09-12 DIAGNOSIS — I4891 Unspecified atrial fibrillation: Secondary | ICD-10-CM | POA: Diagnosis not present

## 2023-09-12 DIAGNOSIS — F10920 Alcohol use, unspecified with intoxication, uncomplicated: Secondary | ICD-10-CM | POA: Diagnosis not present

## 2023-09-12 DIAGNOSIS — J449 Chronic obstructive pulmonary disease, unspecified: Secondary | ICD-10-CM | POA: Diagnosis not present

## 2023-09-12 LAB — CBC
HCT: 31 % — ABNORMAL LOW (ref 39.0–52.0)
Hemoglobin: 10.8 g/dL — ABNORMAL LOW (ref 13.0–17.0)
MCH: 34.8 pg — ABNORMAL HIGH (ref 26.0–34.0)
MCHC: 34.8 g/dL (ref 30.0–36.0)
MCV: 100 fL (ref 80.0–100.0)
Platelets: 172 10*3/uL (ref 150–400)
RBC: 3.1 MIL/uL — ABNORMAL LOW (ref 4.22–5.81)
RDW: 17.2 % — ABNORMAL HIGH (ref 11.5–15.5)
WBC: 5.4 10*3/uL (ref 4.0–10.5)
nRBC: 0 % (ref 0.0–0.2)

## 2023-09-12 LAB — ECHOCARDIOGRAM COMPLETE
AR max vel: 2.85 cm2
AV Area VTI: 2.8 cm2
AV Area mean vel: 2.76 cm2
AV Mean grad: 4 mm[Hg]
AV Peak grad: 5.7 mm[Hg]
Ao pk vel: 1.19 m/s
Area-P 1/2: 3.7 cm2
Height: 68 in
S' Lateral: 3.7 cm
Weight: 2881.85 [oz_av]

## 2023-09-12 LAB — COMPREHENSIVE METABOLIC PANEL
ALT: 14 U/L (ref 0–44)
AST: 22 U/L (ref 15–41)
Albumin: 2.9 g/dL — ABNORMAL LOW (ref 3.5–5.0)
Alkaline Phosphatase: 61 U/L (ref 38–126)
Anion gap: 9 (ref 5–15)
BUN: 12 mg/dL (ref 8–23)
CO2: 25 mmol/L (ref 22–32)
Calcium: 8.6 mg/dL — ABNORMAL LOW (ref 8.9–10.3)
Chloride: 99 mmol/L (ref 98–111)
Creatinine, Ser: 0.76 mg/dL (ref 0.61–1.24)
GFR, Estimated: >60 mL/min (ref >60)
Glucose, Bld: 83 mg/dL (ref 70–99)
Potassium: 3.9 mmol/L (ref 3.5–5.1)
Sodium: 133 mmol/L — ABNORMAL LOW (ref 135–145)
Total Bilirubin: 0.9 mg/dL (ref 0.0–1.2)
Total Protein: 5.9 g/dL — ABNORMAL LOW (ref 6.5–8.1)

## 2023-09-12 MED ORDER — FOLIC ACID 1 MG PO TABS: 1.0000 mg | ORAL_TABLET | Freq: Every day | ORAL | 3 refills | Status: AC

## 2023-09-12 MED ORDER — PANTOPRAZOLE SODIUM 40 MG PO TBEC: 40.0000 mg | DELAYED_RELEASE_TABLET | Freq: Every day | ORAL | 1 refills | Status: AC

## 2023-09-12 MED ORDER — ALPRAZOLAM 0.5 MG PO TABS
0.5000 mg | ORAL_TABLET | Freq: Once | ORAL | Status: AC
  Administered 2023-09-12: 0.5 mg via ORAL
  Filled 2023-09-12: qty 1

## 2023-09-12 MED ORDER — VITAMIN B-1 100 MG PO TABS: 100.0000 mg | ORAL_TABLET | Freq: Every day | ORAL | 3 refills | Status: AC

## 2023-09-12 MED ORDER — POLYVINYL ALCOHOL 1.4 % OP SOLN: 1.0000 [drp] | OPHTHALMIC | Status: DC | PRN

## 2023-09-12 NOTE — Progress Notes (Signed)
Mobility Specialist Progress Note:    09/12/23 1430  Mobility  Activity Ambulated with assistance in hallway;Transferred from chair to bed  Level of Assistance Standby assist, set-up cues, supervision of patient - no hands on  Assistive Device Front wheel walker  Distance Ambulated (ft) 60 ft  Range of Motion/Exercises Active;All extremities  Activity Response Tolerated well  Mobility Referral Yes  Mobility visit 1 Mobility  Mobility Specialist Start Time (ACUTE ONLY) 1345  Mobility Specialist Stop Time (ACUTE ONLY) 1410  Mobility Specialist Time Calculation (min) (ACUTE ONLY) 25 min   Pt received in chair, agreeable to mobility. Required supervision to stand and ambulate with RW. Tolerated well, asx throughout. Returned to room, left pt supine. Alarm on, son in room. All needs met.    Lawerance Bach Mobility Specialist Please contact via Special educational needs teacher or  Rehab office at [phone]

## 2023-09-12 NOTE — Plan of Care (Signed)

## 2023-09-12 NOTE — Discharge Summary (Signed)
Physician Discharge Summary   Patient: Vincent Gaines MRN: [id number] DOB: 02-10-1946  Admit date:     09/11/2023  Discharge date: 09/12/23  Discharge Physician: Vassie Loll   PCP: Benita Stabile, MD   Recommendations at discharge:  Continue assisting patient with alcohol cessation Repeat basic metabolic panel to follow ultralights renal function Reassess blood pressure and adjust antihypertensive regimen as needed   Discharge Diagnoses: Principal Problem:   Atrial fibrillation with RVR (HCC) Active Problems:   Chronic obstructive pulmonary disease (HCC)   Alcohol abuse   Alcoholic intoxication without complication (HCC)   Gastroesophageal reflux disease Primary hypertension Hyperlipidemia  Brief Hospital admit course: Vincent Gaines is a 78 y.o. male with medical history significant of COPD, GERD, hypertension, hypercholesterolemia, anxiety and alcohol abuse/dependency; who presented to the hospital after he was found on his kitchen floor by the neighbors obtunded.  EMS was activated, patient with pinpoint pupils, altered mental status and mildly hypoxic; he received treatment with Narcan, oxygen supplementation and was transported to emergency department.   In the ED patient was found to be on A-fib with RVR (transiently) and is still oriented x 1 only.  No longer requiring oxygen supplementation and with stable blood pressure.   CT head demonstrating no acute intracranial normalities; there was no acute fractures appreciated and normal CT abdomen.  Patient CT chest demonstrated no pulmonary embolism or acute cardiopulmonary process.  Chronic emphysematous changes.   Patient successfully converted to sinus rhythm and overall condition became more stable.  TRH consulted to place patient in observation for further evaluation and management.   Of note, troponin mildly elevated most likely in the setting of arrhythmia.  Assessment and Plan: 1-acute metabolic  encephalopathy -Present at time of admission and associated with alcohol abuse/intoxication -After supportive care, fluid resuscitation and the use of CIWA protocol patient mentation improving back to baseline -Alcohol cessation provided -Folic acid, thiamine and B12 recommended at discharge. -CT head without acute intracranial normalities.  2-transient A-fib with RVR -Driven by alcohol abuse and electrolytes abnormalities -After fluid resuscitation were provided patient auto converted to sinus rhythm -Troponin was mildly elevated in the setting of A-fib with RVR -2D echo reassuring demonstrating preserved ejection fraction and no wall motion abnormalities or significant valvular disorder -Patient encouraged to keep himself alcohol free.  3-lactic acidosis -In the setting of dehydration and alcohol intoxication -Resolved after fluid resuscitation provided.  4-history of COPD -No acute exacerbation appreciated throughout this hospitalization -Resume home bronchodilator management.  5-GERD -Continue PPI.  6-history of anxiety -Continue treatment with benzodiazepine.  7-hyperlipidemia -Continue statin.  8-hypertension -Resume home antihypertensive agents -Heart healthy/low-sodium diet discussed with patient.  Consultants: None Procedures performed: See below for x-ray reports.  2D echo. Disposition: Home Diet recommendation: Heart healthy/low-sodium diet.  DISCHARGE MEDICATION: Allergies as of 09/12/2023   No Known Allergies      Medication List     STOP taking these medications    guaiFENesin 100 MG/5ML liquid Commonly known as: ROBITUSSIN       TAKE these medications    albuterol 108 (90 Base) MCG/ACT inhaler Commonly known as: VENTOLIN HFA Inhale 2 puffs into the lungs every 6 (six) hours as needed for wheezing or shortness of breath.   ALPRAZolam 1 MG tablet Commonly known as: XANAX Take 1 mg by mouth 3 (three) times daily as needed for anxiety or  sleep. Take   folic acid 1 MG tablet Commonly known as: FOLVITE Take 1 tablet (1 mg total) by mouth  daily. Start taking on: September 13, 2023   lactose free nutrition Liqd Take 237 mLs by mouth daily.   LIQUID TEARS OP Apply 1 drop to eye daily with breakfast.   losartan 50 MG tablet Commonly known as: Cozaar Take 1 tablet (50 mg total) by mouth daily.   mometasone-formoterol 200-5 MCG/ACT Aero Commonly known as: DULERA Inhale 2 puffs into the lungs 2 (two) times daily.   pantoprazole 40 MG tablet Commonly known as: PROTONIX Take 1 tablet (40 mg total) by mouth daily. Start taking on: September 13, 2023   PreserVision AREDS 2 Caps Take 1 capsule by mouth daily.   simvastatin 40 MG tablet Commonly known as: ZOCOR Take 40 mg by mouth daily with breakfast.   tadalafil 20 MG tablet Commonly known as: CIALIS Take 20 mg by mouth daily.   thiamine 100 MG tablet Commonly known as: Vitamin B-1 Take 1 tablet (100 mg total) by mouth daily. Start taking on: September 13, 2023   VITAMIN B-12 PO Take 1 tablet by mouth daily.        Follow-up Information     Benita Stabile, MD. Schedule an appointment as soon as possible for a visit in 2 week(s).   Specialty: Internal Medicine Contact information: [address] [place] [address] [phone]                Discharge Exam: Filed Weights   09/11/23 0140 09/11/23 1122  Weight: 79 kg 81.7 kg   General exam: Alert, awake, oriented x 3; no chest pain, no nausea, no vomiting, no complaints of shortness of breath or palpitations. Respiratory system: Clear to auscultation. Respiratory effort normal.  Good air movement bilaterally.  No using accessory muscle.  Good saturation on room air. Cardiovascular system:RRR. No murmurs, rubs, gallops.  No JVD. Gastrointestinal system: Abdomen is nondistended, soft and nontender. No organomegaly or masses felt. Normal bowel sounds heard. Central nervous system: Alert and  oriented. No focal neurological deficits. Extremities: No cyanosis or clubbing. Skin: No petechiae. Psychiatry: Judgement and insight appear normal. Mood & affect appropriate.   Condition at discharge: Stable and improved.  The results of significant diagnostics from this hospitalization (including imaging, microbiology, ancillary and laboratory) are listed below for reference.   Imaging Studies: ECHOCARDIOGRAM COMPLETE Result Date: 09/12/2023    ECHOCARDIOGRAM REPORT   Patient Name:   EMMIT ORILEY Date of Exam: 09/12/2023 Medical Rec #:  [id number]           Height:       68.0 in Accession #:    [id number]          Weight:       180.1 lb Date of Birth:  1946-04-04           BSA:          1.955 m Patient Age:    77 years            BP:           118/69 mmHg Patient Gender: M                   HR:           85 bpm. [facility] Procedure: 2D Echo, Cardiac Doppler and Color Doppler Indications:    Elevated Troponin  History:        Patient has no prior history of Echocardiogram examinations.  COPD; Risk Factors:Hypertension.  Sonographer:    Darlys Gales Referring Phys: [phone] Avin Gibbons IMPRESSIONS  1. Left ventricular ejection fraction, by estimation, is 55 to 60%. The left ventricle has normal function. The left ventricle has no regional wall motion abnormalities. Left ventricular diastolic parameters were normal.  2. Right ventricular systolic function is normal. The right ventricular size is normal.  3. Left atrial size was mildly dilated.  4. The mitral valve is normal in structure. Trivial mitral valve regurgitation.  5. The aortic valve is tricuspid. Aortic valve regurgitation is not visualized. FINDINGS  Left Ventricle: Left ventricular ejection fraction, by estimation, is 55 to 60%. The left ventricle has normal function. The left ventricle has no regional wall motion abnormalities. The left ventricular internal cavity size was normal in size. There is  no left  ventricular hypertrophy. Left ventricular diastolic parameters were normal. Right Ventricle: The right ventricular size is normal. Right vetricular wall thickness was not assessed. Right ventricular systolic function is normal. Left Atrium: Left atrial size was mildly dilated. Right Atrium: Right atrial size was normal in size. Pericardium: There is no evidence of pericardial effusion. Mitral Valve: The mitral valve is normal in structure. Trivial mitral valve regurgitation. Tricuspid Valve: The tricuspid valve is normal in structure. Tricuspid valve regurgitation is trivial. Aortic Valve: The aortic valve is tricuspid. Aortic valve regurgitation is not visualized. Aortic valve mean gradient measures 4.0 mmHg. Aortic valve peak gradient measures 5.7 mmHg. Aortic valve area, by VTI measures 2.80 cm. Pulmonic Valve: The pulmonic valve was not well visualized. Pulmonic valve regurgitation is not visualized. No evidence of pulmonic stenosis. Aorta: The aortic root is normal in size and structure. Venous: The inferior vena cava was not well visualized. IAS/Shunts: The interatrial septum was not assessed.  LEFT VENTRICLE PLAX 2D LVIDd:         5.00 cm   Diastology LVIDs:         3.70 cm   LV e' medial:    8.81 cm/s LV PW:         1.05 cm   LV E/e' medial:  8.7 LV IVS:        0.90 cm   LV e' lateral:   10.90 cm/s LVOT diam:     2.00 cm   LV E/e' lateral: 7.0 LV SV:         73 LV SV Index:   37 LVOT Area:     3.14 cm  RIGHT VENTRICLE RV S prime:     13.60 cm/s TAPSE (M-mode): 2.4 cm LEFT ATRIUM           Index        RIGHT ATRIUM           Index LA Vol (A2C): 82.6 ml 42.26 ml/m  RA Area:     11.80 cm LA Vol (A4C): 34.3 ml 17.55 ml/m  RA Volume:   27.80 ml  14.22 ml/m  AORTIC VALVE AV Area (Vmax):    2.85 cm AV Area (Vmean):   2.76 cm AV Area (VTI):     2.80 cm AV Vmax:           119.00 cm/s AV Vmean:          90.700 cm/s AV VTI:            0.259 m AV Peak Grad:      5.7 mmHg AV Mean Grad:      4.0 mmHg LVOT Vmax:  108.00 cm/s LVOT Vmean:        79.700 cm/s LVOT VTI:          0.231 m LVOT/AV VTI ratio: 0.89  AORTA Ao Root diam: 3.20 cm MITRAL VALVE MV Area (PHT): 3.70 cm    SHUNTS MV Decel Time: 205 msec    Systemic VTI:  0.23 m MV E velocity: 76.30 cm/s  Systemic Diam: 2.00 cm MV A velocity: 84.00 cm/s MV E/A ratio:  0.91 Dietrich Pates MD Electronically signed by Dietrich Pates MD Signature Date/Time: 09/12/2023/2:46:07 PM    Final    CT Angio Chest PE W and/or Wo Contrast Result Date: 09/11/2023 CLINICAL DATA:  [name] did a well check-in found patient unresponsive on kitchen floor. Numerous empty 1 bottle scattered around residents. Low oxygen saturations. Patient unresponsive after Narcan. EXAM: CT ANGIOGRAPHY CHEST CT ABDOMEN AND PELVIS WITH CONTRAST TECHNIQUE: Multidetector CT imaging of the chest was performed using the standard protocol during bolus administration of intravenous contrast. Multiplanar CT image reconstructions and MIPs were obtained to evaluate the vascular anatomy. Multidetector CT imaging of the abdomen and pelvis was performed using the standard protocol during bolus administration of intravenous contrast. RADIATION DOSE REDUCTION: This exam was performed according to the departmental dose-optimization program which includes automated exposure control, adjustment of the mA and/or kV according to patient size and/or use of iterative reconstruction technique. CONTRAST:  OMNIPAQUE IOHEXOL 350 MG/ML SOLN COMPARISON:  Same day chest radiograph; CT chest abdomen and pelvis 01/28/2022 FINDINGS: CTA CHEST FINDINGS Cardiovascular: Negative for acute pulmonary embolism. Coronary artery and aortic atherosclerotic calcification. No pericardial effusion. Mediastinum/Nodes: Trachea and esophagus are unremarkable. No thoracic adenopathy. Lungs/Pleura: Peripheral subpleural reticular and ground-glass opacities likely due to chronic scarring. Bibasilar atelectasis. No focal pneumonia, pleural effusion, or  pneumothorax. Musculoskeletal: No acute fracture. Remote bilateral rib fractures. Chronic compression fracture of T12. Symmetric gynecomastia. Review of the MIP images confirms the above findings. CT ABDOMEN and PELVIS FINDINGS Hepatobiliary: Hepatic steatosis. Gallbladder and biliary tree are unremarkable. Pancreas: Unremarkable. Spleen: Spleen is not distinctly visualized. Adrenals/Urinary Tract: Normal adrenal glands. No urinary calculi or hydronephrosis. Unremarkable bladder. Stomach/Bowel: Normal caliber large and small bowel. Stomach is within normal limits. No bowel wall thickening. Normal appendix. Extensive sigmoid diverticulosis without evidence of diverticulitis. Vascular/Lymphatic: Aortic atherosclerosis. No enlarged abdominal or pelvic lymph nodes. Reproductive: Unremarkable. Other: No free intraperitoneal fluid or air. Musculoskeletal: No acute fracture. Review of the MIP images confirms the above findings. IMPRESSION: 1. Negative for acute pulmonary embolism. 2. No acute abnormality in the chest, abdomen, or pelvis. 3. Hepatic steatosis. 4.  Aortic Atherosclerosis (ICD10-I70.0). Electronically Signed   By: Minerva Fester M.D.   On: 09/11/2023 03:40   CT ABDOMEN PELVIS W CONTRAST Result Date: 09/11/2023 CLINICAL DATA:  [name] did a well check-in found patient unresponsive on kitchen floor. Numerous empty 1 bottle scattered around residents. Low oxygen saturations. Patient unresponsive after Narcan. EXAM: CT ANGIOGRAPHY CHEST CT ABDOMEN AND PELVIS WITH CONTRAST TECHNIQUE: Multidetector CT imaging of the chest was performed using the standard protocol during bolus administration of intravenous contrast. Multiplanar CT image reconstructions and MIPs were obtained to evaluate the vascular anatomy. Multidetector CT imaging of the abdomen and pelvis was performed using the standard protocol during bolus administration of intravenous contrast. RADIATION DOSE REDUCTION: This exam was performed according  to the departmental dose-optimization program which includes automated exposure control, adjustment of the mA and/or kV according to patient size and/or use of iterative reconstruction technique. CONTRAST:  OMNIPAQUE IOHEXOL  350 MG/ML SOLN COMPARISON:  Same day chest radiograph; CT chest abdomen and pelvis 01/28/2022 FINDINGS: CTA CHEST FINDINGS Cardiovascular: Negative for acute pulmonary embolism. Coronary artery and aortic atherosclerotic calcification. No pericardial effusion. Mediastinum/Nodes: Trachea and esophagus are unremarkable. No thoracic adenopathy. Lungs/Pleura: Peripheral subpleural reticular and ground-glass opacities likely due to chronic scarring. Bibasilar atelectasis. No focal pneumonia, pleural effusion, or pneumothorax. Musculoskeletal: No acute fracture. Remote bilateral rib fractures. Chronic compression fracture of T12. Symmetric gynecomastia. Review of the MIP images confirms the above findings. CT ABDOMEN and PELVIS FINDINGS Hepatobiliary: Hepatic steatosis. Gallbladder and biliary tree are unremarkable. Pancreas: Unremarkable. Spleen: Spleen is not distinctly visualized. Adrenals/Urinary Tract: Normal adrenal glands. No urinary calculi or hydronephrosis. Unremarkable bladder. Stomach/Bowel: Normal caliber large and small bowel. Stomach is within normal limits. No bowel wall thickening. Normal appendix. Extensive sigmoid diverticulosis without evidence of diverticulitis. Vascular/Lymphatic: Aortic atherosclerosis. No enlarged abdominal or pelvic lymph nodes. Reproductive: Unremarkable. Other: No free intraperitoneal fluid or air. Musculoskeletal: No acute fracture. Review of the MIP images confirms the above findings. IMPRESSION: 1. Negative for acute pulmonary embolism. 2. No acute abnormality in the chest, abdomen, or pelvis. 3. Hepatic steatosis. 4.  Aortic Atherosclerosis (ICD10-I70.0). Electronically Signed   By: Minerva Fester M.D.   On: 09/11/2023 03:40   CT HEAD WO  CONTRAST Result Date: 09/11/2023 CLINICAL DATA:  Head trauma, moderate-severe; Neck trauma, intoxicated or obtunded (Age >= 16y) EXAM: CT HEAD WITHOUT CONTRAST CT CERVICAL SPINE WITHOUT CONTRAST TECHNIQUE: Multidetector CT imaging of the head and cervical spine was performed following the standard protocol without intravenous contrast. Multiplanar CT image reconstructions of the cervical spine were also generated. RADIATION DOSE REDUCTION: This exam was performed according to the departmental dose-optimization program which includes automated exposure control, adjustment of the mA and/or kV according to patient size and/or use of iterative reconstruction technique. COMPARISON:  CT head January 28, 2022.  CTA chest January 28, 2022. FINDINGS: CT HEAD FINDINGS Brain: No evidence of acute infarction, hemorrhage, hydrocephalus, extra-axial collection or mass lesion/mass effect. Cerebral atrophy. Vascular: Calcific atherosclerosis. Skull: No acute fracture. Sinuses/Orbits: Moderate paranasal sinus mucosal thickening. No acute orbital findings. Other: No mastoid effusions. CT CERVICAL SPINE FINDINGS Alignment: No substantial sagittal subluxation. Skull base and vertebrae: Mild height loss at C5 and T1, similar to CT of the chest from 2023. New height loss. No evidence of acute fracture. Posterior cerclage wire fixation at C3 to C6. Soft tissues and spinal canal: No prevertebral fluid or swelling. No visible canal hematoma. Disc levels:  Moderate multilevel degenerative change. Upper chest: Visualized lung apices are clear. IMPRESSION: No evidence of acute abnormality intracranially or in the cervical spine. Electronically Signed   By: Feliberto Harts M.D.   On: 09/11/2023 03:33   CT CERVICAL SPINE WO CONTRAST Result Date: 09/11/2023 CLINICAL DATA:  Head trauma, moderate-severe; Neck trauma, intoxicated or obtunded (Age >= 16y) EXAM: CT HEAD WITHOUT CONTRAST CT CERVICAL SPINE WITHOUT CONTRAST TECHNIQUE: Multidetector CT  imaging of the head and cervical spine was performed following the standard protocol without intravenous contrast. Multiplanar CT image reconstructions of the cervical spine were also generated. RADIATION DOSE REDUCTION: This exam was performed according to the departmental dose-optimization program which includes automated exposure control, adjustment of the mA and/or kV according to patient size and/or use of iterative reconstruction technique. COMPARISON:  CT head January 28, 2022.  CTA chest January 28, 2022. FINDINGS: CT HEAD FINDINGS Brain: No evidence of acute infarction, hemorrhage, hydrocephalus, extra-axial collection or mass lesion/mass effect. Cerebral atrophy.  Vascular: Calcific atherosclerosis. Skull: No acute fracture. Sinuses/Orbits: Moderate paranasal sinus mucosal thickening. No acute orbital findings. Other: No mastoid effusions. CT CERVICAL SPINE FINDINGS Alignment: No substantial sagittal subluxation. Skull base and vertebrae: Mild height loss at C5 and T1, similar to CT of the chest from 2023. New height loss. No evidence of acute fracture. Posterior cerclage wire fixation at C3 to C6. Soft tissues and spinal canal: No prevertebral fluid or swelling. No visible canal hematoma. Disc levels:  Moderate multilevel degenerative change. Upper chest: Visualized lung apices are clear. IMPRESSION: No evidence of acute abnormality intracranially or in the cervical spine. Electronically Signed   By: Feliberto Harts M.D.   On: 09/11/2023 03:33   DG Chest Port 1 View Result Date: 09/11/2023 CLINICAL DATA:  Sepsis EXAM: PORTABLE CHEST 1 VIEW COMPARISON:  08/25/2023 FINDINGS: The heart size and mediastinal contours are within normal limits. Both lungs are clear. The visualized skeletal structures are unremarkable. Bibasilar atelectasis. IMPRESSION: Bibasilar atelectasis. Electronically Signed   By: Deatra Robinson M.D.   On: 09/11/2023 02:58   DG Chest Port 1 View Result Date: 08/25/2023 CLINICAL DATA:   Shortness of breath. EXAM: PORTABLE CHEST 1 VIEW COMPARISON:  June 29, 2023. FINDINGS: Stable cardiomediastinal silhouette. Old left rib fractures are noted. Minimal bibasilar subsegmental atelectasis or scarring is noted. IMPRESSION: Minimal bibasilar subsegmental atelectasis or scarring. Electronically Signed   By: Lupita Raider M.D.   On: 08/25/2023 17:14    Microbiology: Results for orders placed or performed during the hospital encounter of 09/11/23  Blood Culture (routine x 2)     Status: None (Preliminary result)   Collection Time: 09/11/23  2:05 AM   Specimen: BLOOD RIGHT HAND  Result Value Ref Range Status   Specimen Description   Final    BLOOD RIGHT HAND Performed at [facility], [address], [place], [place] [address]    Special Requests   Final    NONE Performed at [facility], [address] [place], [place] [address]    Culture   Final    NO GROWTH 1 DAY Performed at [facility], [address] [place], [place] [address]    Report Status PENDING  Incomplete  Blood Culture (routine x 2)     Status: None (Preliminary result)   Collection Time: 09/11/23  2:05 AM   Specimen: BLOOD LEFT HAND  Result Value Ref Range Status   Specimen Description   Final    BLOOD LEFT HAND Performed at [facility], [address] [place], [place] [address]    Special Requests   Final    NONE Performed at [facility], [address] [place], [place] [address]    Culture   Final    NO GROWTH 1 DAY Performed at [facility], [address] [place], [place] [address]    Report Status PENDING  Incomplete    Labs: CBC: Recent Labs  Lab 09/11/23 0205 09/12/23 0419  WBC 8.3 5.4  NEUTROABS 6.2  --   HGB 12.3* 10.8*  HCT 36.7* 31.0*  MCV 102.5* 100.0  PLT 172 172   Basic Metabolic Panel: Recent Labs  Lab 09/11/23 0205 09/12/23 0419  NA 135 133*  K 4.0 3.9  CL 97* 99  CO2 22 25  GLUCOSE 133* 83  BUN 10 12  CREATININE 1.06 0.76  CALCIUM 8.9 8.6*   MG 2.1  --    Liver Function Tests: Recent Labs  Lab 09/11/23 0205 09/12/23 0419  AST 54* 22  ALT 22 14  ALKPHOS 76 61  BILITOT 0.6 0.9  PROT 7.2 5.9*  ALBUMIN 3.5 2.9*   CBG: No results for input(s): "GLUCAP" in the last 168 hours.  Discharge time spent: greater than 30 minutes.  Signed: Vassie Loll, MD [facility] 09/12/2023

## 2023-09-12 NOTE — TOC Transition Note (Signed)
Transition of Care [facility]) - Discharge Note   Patient Details  Name: Vincent Gaines MRN: [id number] Date of Birth: 05/05/1946  [facility] CM/SW Contact:  Isabella Bowens, LCSWA Phone Number: 09/12/2023, 1:42 PM   Clinical Narrative:    CSW spoke with pt at bedside regarding PT recommendation for HHPT. Pt stated that he would like for [facility] to come back out since he had them last year. Cory with [facility] made aware and is able to accept. Will have MD place order. Pt states that he lives alone but his daughter stays with him often. Pt has a rolling walker at home and states that he still can drive at times. Patient children were on the way to pick him up today since , pt was informed that he would be DC later on today .TOC signing off.    Final next level of care: Home w Home Health Services Barriers to Discharge: Barriers Resolved   Patient Goals and CMS Choice Patient states their goals for this hospitalization and ongoing recovery are:: return back home CMS Medicare.gov Compare Post Acute Care list provided to:: Patient Choice offered to / list presented to : Patient [place] ownership interest in [facility].provided to:: Patient    Discharge Placement        Return back home with home health           Name of family member notified: Patient Patient and family notified of of transfer: 09/12/23  Discharge Plan and Services Additional resources added to the After Visit Summary for         [facility] Arranged: PT [facility] Agency: [facility] Date [facility] Agency Contacted: 09/12/23 Time HH Agency Contacted: 1341    Social Drivers of Health (SDOH) Interventions SDOH Screenings   Food Insecurity: No Food Insecurity (09/11/2023)  Housing: Low Risk  (09/11/2023)  Transportation Needs: No Transportation Needs (09/11/2023)  Utilities: Not At Risk (09/11/2023)  Social Connections: Moderately Integrated (09/11/2023)  Tobacco Use: Medium Risk (09/11/2023)      Readmission Risk Interventions    09/12/2023    1:39 PM  Readmission Risk Prevention Plan  Transportation Screening Complete  Home Care Screening Complete  Medication Review (RN CM) Complete

## 2023-09-12 NOTE — Plan of Care (Signed)

## 2023-09-16 LAB — CULTURE, BLOOD (ROUTINE X 2)
Culture: NO GROWTH
Culture: NO GROWTH

## 2023-10-03 DIAGNOSIS — F17200 Nicotine dependence, unspecified, uncomplicated: Secondary | ICD-10-CM | POA: Diagnosis not present

## 2023-10-03 DIAGNOSIS — J449 Chronic obstructive pulmonary disease, unspecified: Secondary | ICD-10-CM | POA: Diagnosis not present

## 2023-10-03 DIAGNOSIS — W19XXXD Unspecified fall, subsequent encounter: Secondary | ICD-10-CM | POA: Diagnosis not present

## 2023-10-03 DIAGNOSIS — D649 Anemia, unspecified: Secondary | ICD-10-CM | POA: Diagnosis not present

## 2023-10-03 DIAGNOSIS — I10 Essential (primary) hypertension: Secondary | ICD-10-CM | POA: Diagnosis not present

## 2023-10-03 DIAGNOSIS — I4891 Unspecified atrial fibrillation: Secondary | ICD-10-CM | POA: Diagnosis not present

## 2023-10-03 DIAGNOSIS — K760 Fatty (change of) liver, not elsewhere classified: Secondary | ICD-10-CM | POA: Diagnosis not present

## 2023-10-03 DIAGNOSIS — E782 Mixed hyperlipidemia: Secondary | ICD-10-CM | POA: Diagnosis not present

## 2023-10-03 DIAGNOSIS — G9341 Metabolic encephalopathy: Secondary | ICD-10-CM | POA: Diagnosis not present

## 2023-10-03 DIAGNOSIS — E871 Hypo-osmolality and hyponatremia: Secondary | ICD-10-CM | POA: Diagnosis not present

## 2023-10-18 DIAGNOSIS — J449 Chronic obstructive pulmonary disease, unspecified: Secondary | ICD-10-CM | POA: Diagnosis not present

## 2023-10-18 DIAGNOSIS — D649 Anemia, unspecified: Secondary | ICD-10-CM | POA: Diagnosis not present

## 2023-10-18 DIAGNOSIS — K760 Fatty (change of) liver, not elsewhere classified: Secondary | ICD-10-CM | POA: Diagnosis not present

## 2023-10-18 DIAGNOSIS — J302 Other seasonal allergic rhinitis: Secondary | ICD-10-CM | POA: Diagnosis not present

## 2023-10-18 DIAGNOSIS — E871 Hypo-osmolality and hyponatremia: Secondary | ICD-10-CM | POA: Diagnosis not present

## 2023-10-18 DIAGNOSIS — I10 Essential (primary) hypertension: Secondary | ICD-10-CM | POA: Diagnosis not present

## 2023-10-18 DIAGNOSIS — F17210 Nicotine dependence, cigarettes, uncomplicated: Secondary | ICD-10-CM | POA: Diagnosis not present

## 2023-10-18 DIAGNOSIS — K219 Gastro-esophageal reflux disease without esophagitis: Secondary | ICD-10-CM | POA: Diagnosis not present

## 2023-10-18 DIAGNOSIS — I4891 Unspecified atrial fibrillation: Secondary | ICD-10-CM | POA: Diagnosis not present

## 2023-10-20 ENCOUNTER — Ambulatory Visit (INDEPENDENT_AMBULATORY_CARE_PROVIDER_SITE_OTHER)

## 2023-10-20 ENCOUNTER — Ambulatory Visit
Admission: EM | Admit: 2023-10-20 | Discharge: 2023-10-20 | Disposition: A | Discharge status: Home / Self Care | Attending: Nurse Practitioner | Admitting: Nurse Practitioner

## 2023-10-20 DIAGNOSIS — M25531 Pain in right wrist: Secondary | ICD-10-CM | POA: Diagnosis not present

## 2023-10-20 DIAGNOSIS — M79641 Pain in right hand: Secondary | ICD-10-CM

## 2023-10-20 DIAGNOSIS — S52614A Nondisplaced fracture of right ulna styloid process, initial encounter for closed fracture: Secondary | ICD-10-CM | POA: Diagnosis not present

## 2023-10-20 DIAGNOSIS — M1811 Unilateral primary osteoarthritis of first carpometacarpal joint, right hand: Secondary | ICD-10-CM | POA: Diagnosis not present

## 2023-10-20 NOTE — ED Triage Notes (Signed)
 Pt reports he fell while taking a walk last night and now has right hand and wrist swelling. Pt hit the back of his head. States he did not trip over anything he "just fell" . Pt fell again while trying to get up.
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
## 2023-10-20 NOTE — Discharge Instructions (Signed)
 It appears that you most likely have a break or fracture in the right wrist.  A splint has been applied for immobilization and compression.  Keep the splint in place until you have been evaluated by orthopedics. May take over-the-counter Tylenol as needed for pain or discomfort. RICE therapy, rest, ice, compression, and elevation.  Elevate the right upper extremity is much as possible to help with swelling.  Apply ice for 20 minutes, remove for 1 hour, repeat as needed. I would like for you to follow-up with orthopedics on 3/14 for further evaluation.  Have given you information for [facility] [place] and for [facility]. Please call first thing tomorrow morning to schedule an appointment at either office. Follow-up as needed.

## 2023-10-21 ENCOUNTER — Telehealth: Payer: Self-pay | Admitting: Emergency Medicine

## 2023-10-21 NOTE — Telephone Encounter (Signed)
 Provider reported to reach out to pt and notify of xray results. Attempted to contact pt at number listed in the chart. No answer at this time.

## 2023-10-21 NOTE — Telephone Encounter (Signed)
 Attempted to reach pt to notify that xray did result positive for ulnar fracture and possible radial fracture and to ensure pt was able to get ortho follow up scheduled. No answer x2.

## 2023-11-02 DIAGNOSIS — I10 Essential (primary) hypertension: Secondary | ICD-10-CM | POA: Diagnosis not present

## 2023-11-08 DIAGNOSIS — J302 Other seasonal allergic rhinitis: Secondary | ICD-10-CM | POA: Diagnosis not present

## 2023-11-08 DIAGNOSIS — K760 Fatty (change of) liver, not elsewhere classified: Secondary | ICD-10-CM | POA: Diagnosis not present

## 2023-11-08 DIAGNOSIS — E782 Mixed hyperlipidemia: Secondary | ICD-10-CM | POA: Diagnosis not present

## 2023-11-08 DIAGNOSIS — K219 Gastro-esophageal reflux disease without esophagitis: Secondary | ICD-10-CM | POA: Diagnosis not present

## 2023-11-08 DIAGNOSIS — J449 Chronic obstructive pulmonary disease, unspecified: Secondary | ICD-10-CM | POA: Diagnosis not present

## 2023-11-08 DIAGNOSIS — I4891 Unspecified atrial fibrillation: Secondary | ICD-10-CM | POA: Diagnosis not present

## 2023-11-08 DIAGNOSIS — S52614D Nondisplaced fracture of right ulna styloid process, subsequent encounter for closed fracture with routine healing: Secondary | ICD-10-CM | POA: Diagnosis not present

## 2023-11-08 DIAGNOSIS — I10 Essential (primary) hypertension: Secondary | ICD-10-CM | POA: Diagnosis not present

## 2023-11-08 DIAGNOSIS — D649 Anemia, unspecified: Secondary | ICD-10-CM | POA: Diagnosis not present

## 2023-11-08 DIAGNOSIS — E871 Hypo-osmolality and hyponatremia: Secondary | ICD-10-CM | POA: Diagnosis not present

## 2023-11-26 IMAGING — CT CT HEAD W/O CM
4 series · 16 of 47 positions shown, 18 images · non-contrast
Comparison: None Available.

CLINICAL DATA: Mental status change, unknown cause



[Series 3: head w o · axial · 0.47mm/px · z∈[+1359,+1479]mm · 7 of 34 slices shown, 9 images]
[im 5/34  brain]
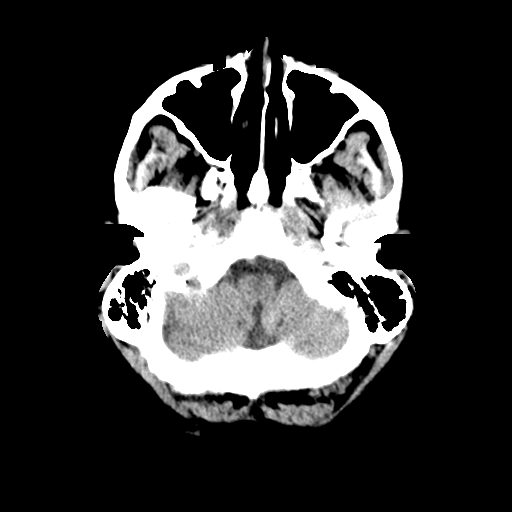
[im 5/34  bone]
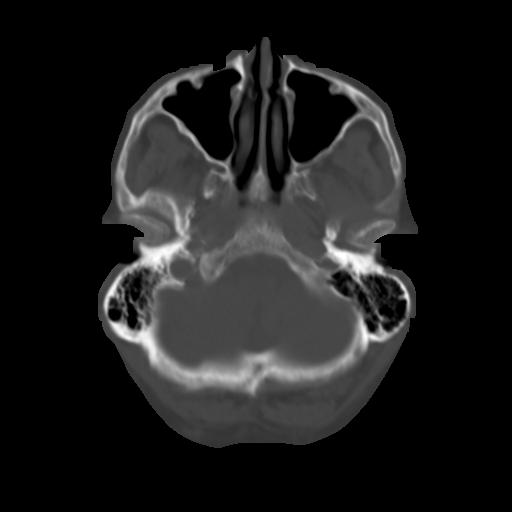
[im 9/34  brain]
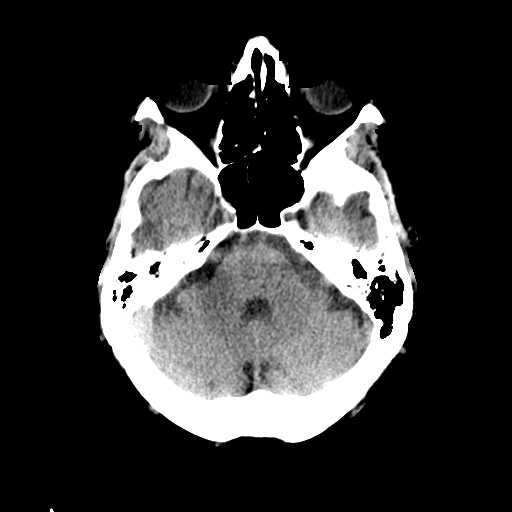
[im 13/34  brain]
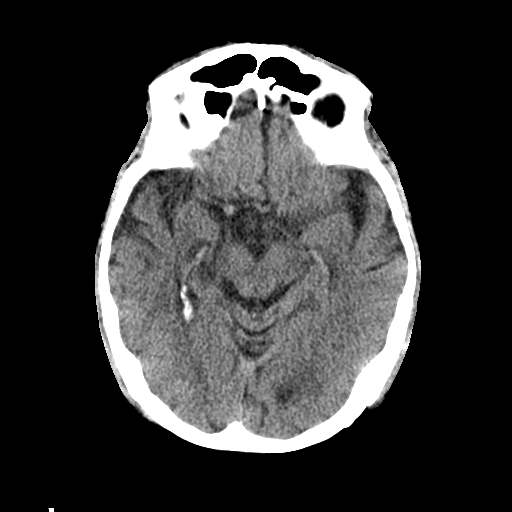
[im 17/34  brain]
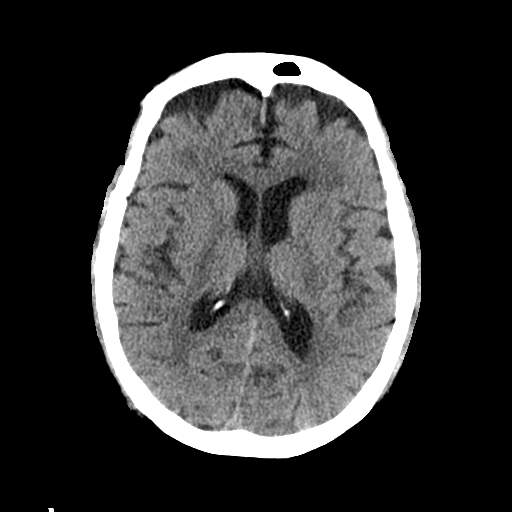
[im 21/34  brain]
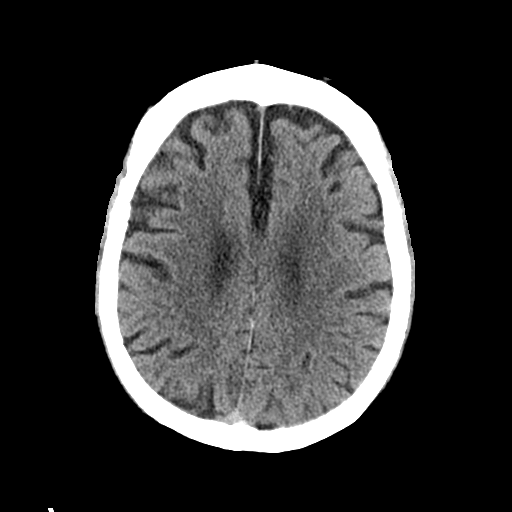
[im 21/34  bone]
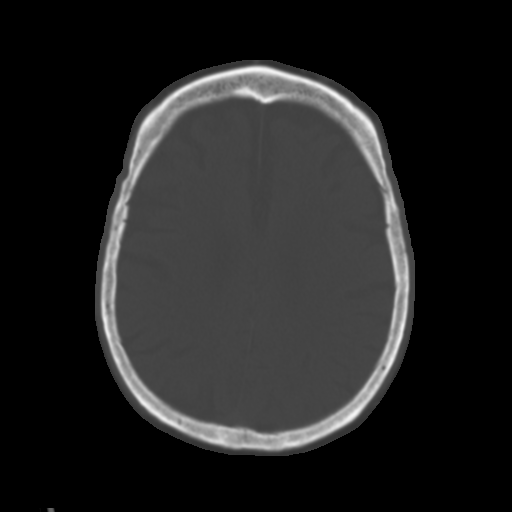
[im 25/34  brain]
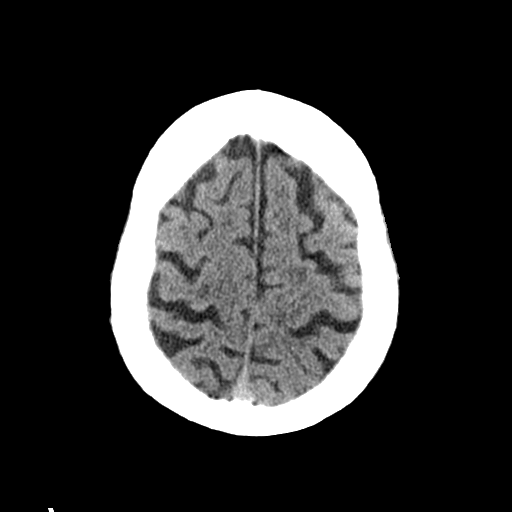
[im 29/34  brain]
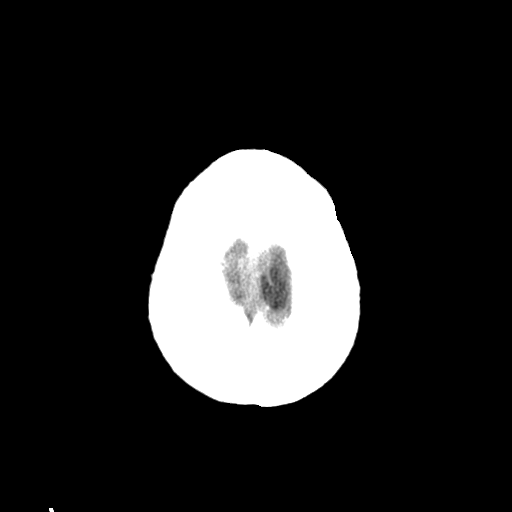

[Series 4: head bone · axial · 0.47mm/px · z∈[+1355,+1387]mm · 3 of 84 slices shown]
[im 9/84  bone]
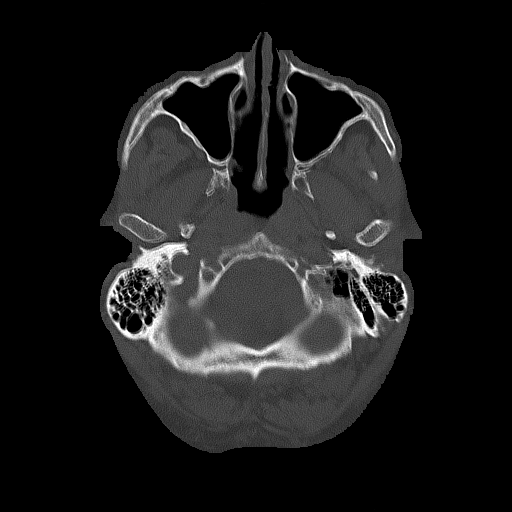
[im 17/84  bone]
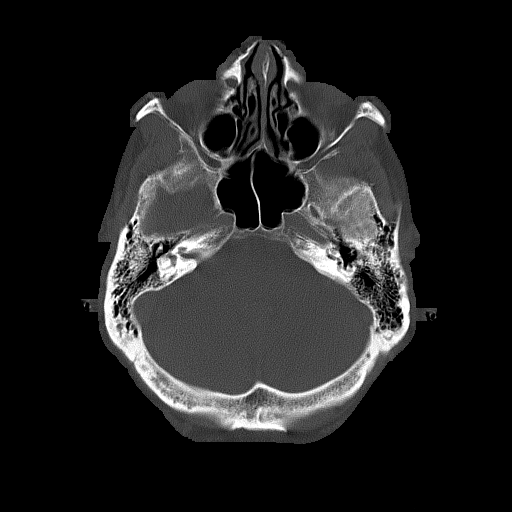
[im 25/84  bone]
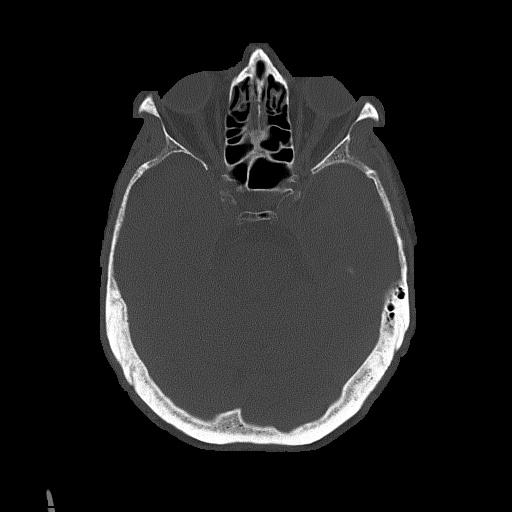

[Series 5: coronal soft · coronal · 0.34mm/px · 3 of 75 slices shown]
[im 25/75  brain]
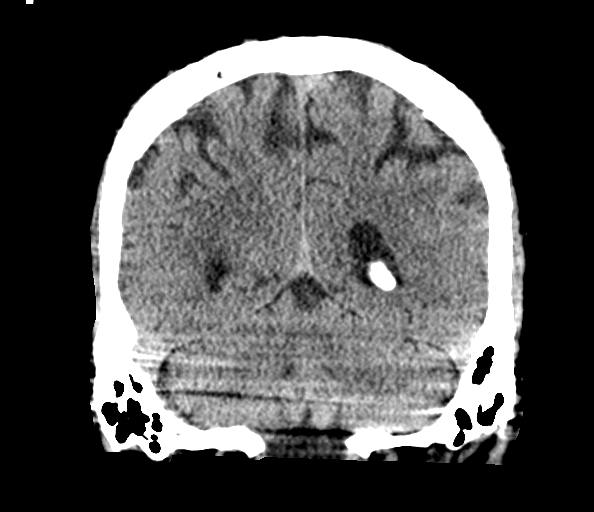
[im 33/75  brain]
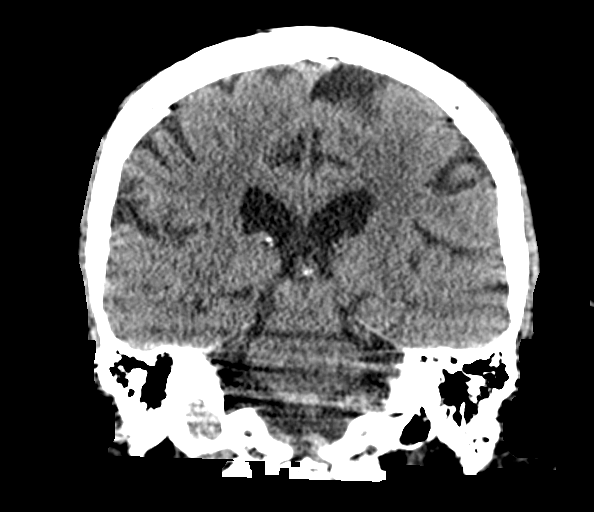
[im 42/75  brain]
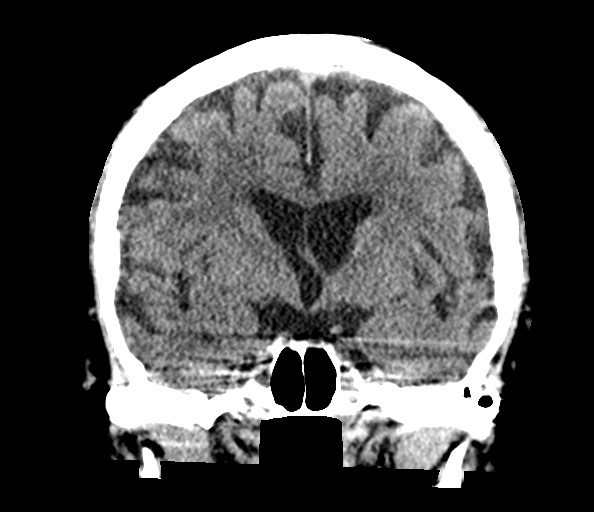

[Series 6: sagittal soft · sagittal · 0.34mm/px · 3 of 68 slices shown]
[im 23/68  brain]
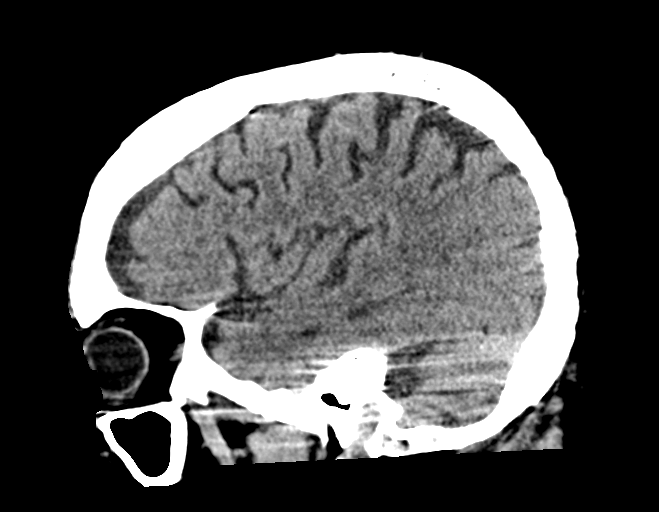
[im 34/68  brain]
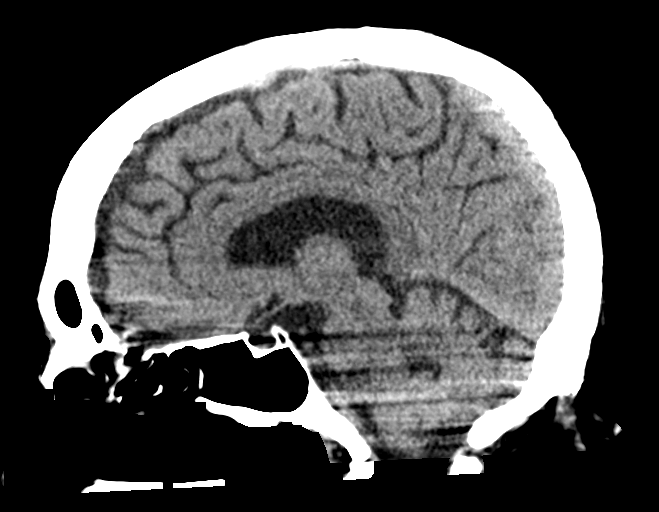
[im 45/68  brain]
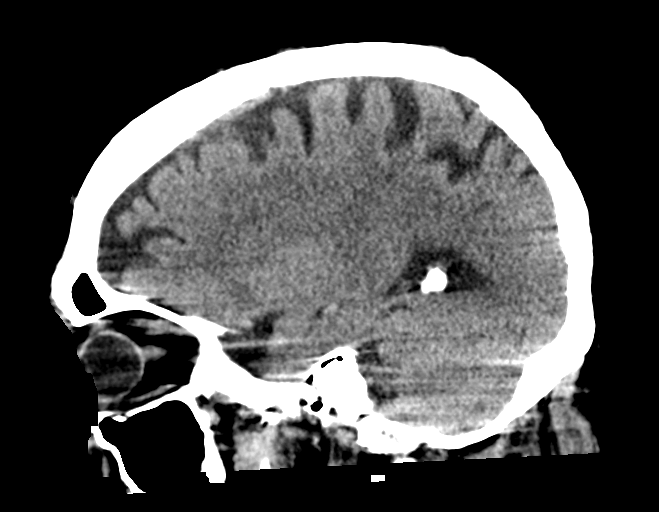

[16 of 47 positions shown; findings below may reference images not displayed]

FINDINGS: Brain: There is no acute intracranial hemorrhage, mass effect, or
edema. Gray-white differentiation is preserved. There is no
extra-axial fluid collection. Prominence of the ventricles and sulci
reflects mild parenchymal volume loss. Patchy hypoattenuation in the
supratentorial white matter is nonspecific but may reflect mild to
moderate chronic microvascular ischemic changes.

Vascular: There is atherosclerotic calcification at the skull base.

Skull: Calvarium is unremarkable.

Sinuses/Orbits: No acute finding.

Other: None.
IMPRESSION: No acute intracranial abnormality. Chronic microvascular ischemic
changes.

## 2023-11-26 IMAGING — DX DG CHEST 1V PORT
1 series · 1 of 1 positions shown · non-contrast
Comparison: 01/28/2022

CLINICAL DATA: Line placement verification

EXAM:
PORTABLE CHEST 1 VIEW

[chest ap]
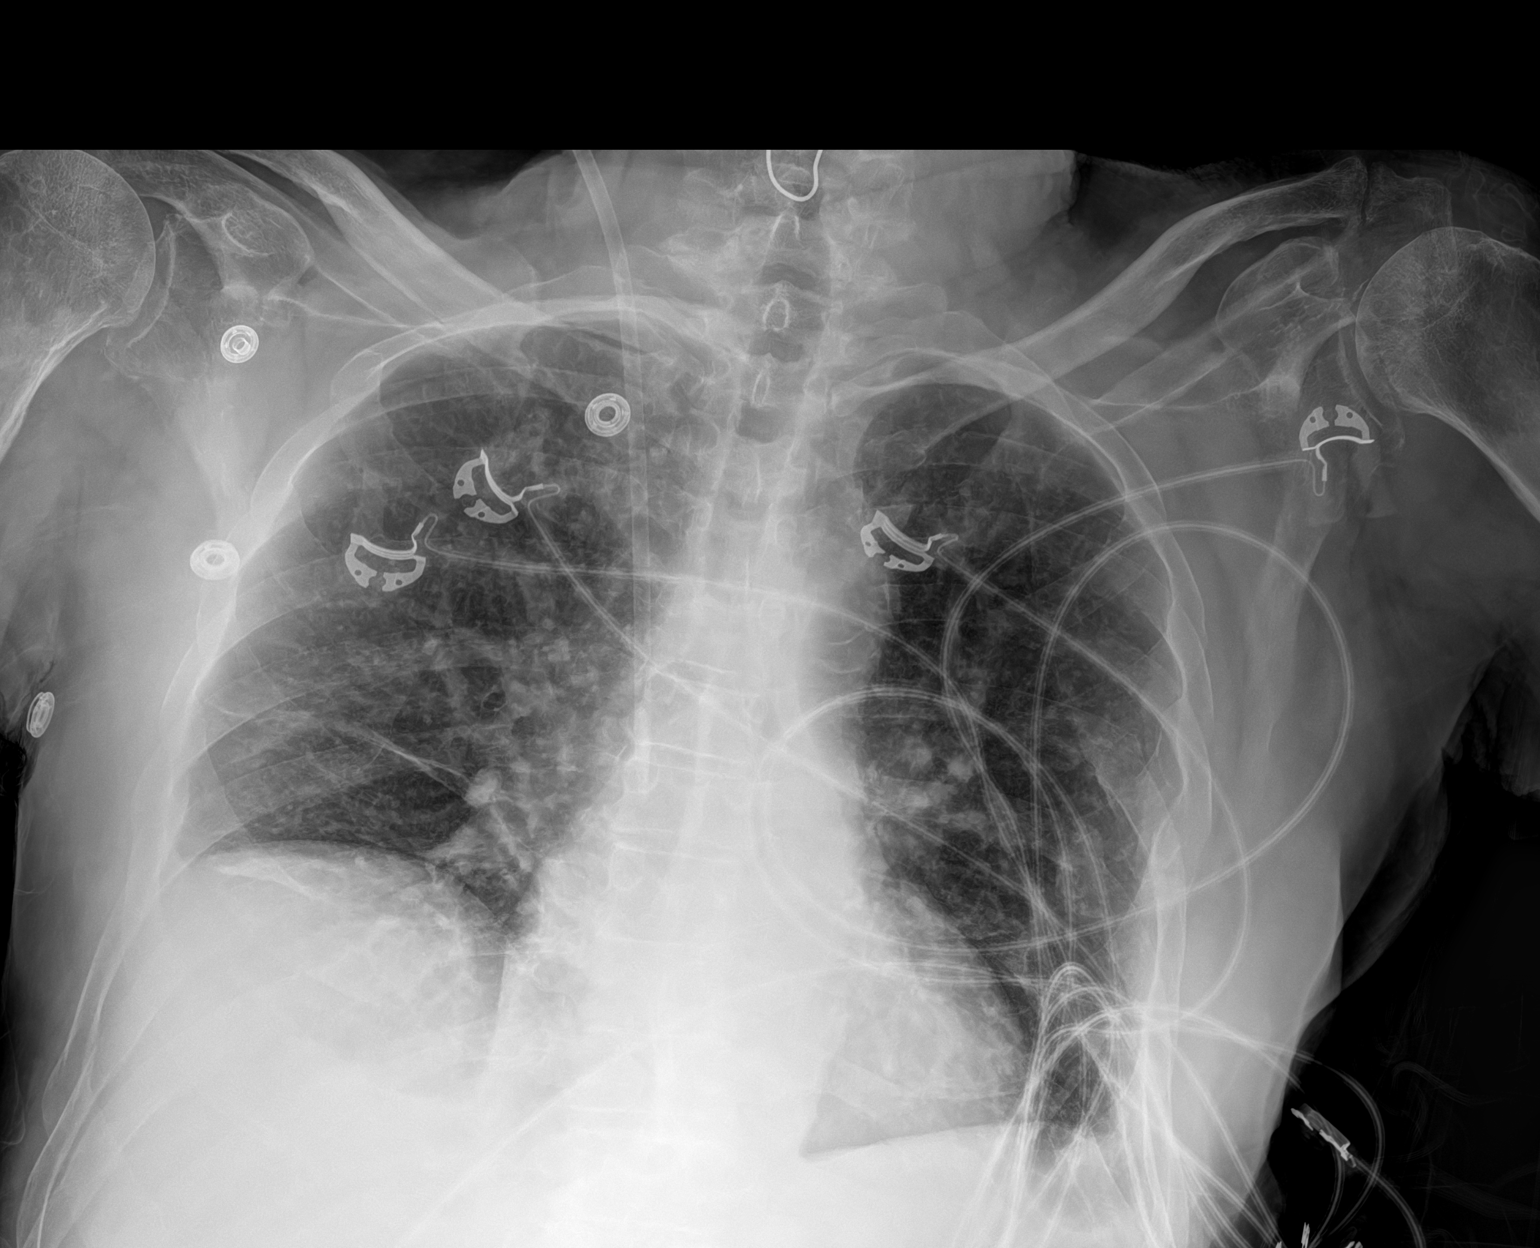

[1 of 1 positions shown; findings below may reference images not displayed]

FINDINGS: Right jugular central venous catheter tip at the cavoatrial
junction. No pneumothorax.

Elevated right hemidiaphragm with mild right lower lobe atelectasis
unchanged. Mild atelectasis or scarring left lung base unchanged

Chronic rib fractures bilaterally.
IMPRESSION: Satisfactory central line placement.  No acute abnormality

## 2023-12-06 DIAGNOSIS — H04123 Dry eye syndrome of bilateral lacrimal glands: Secondary | ICD-10-CM | POA: Diagnosis not present

## 2024-01-16 ENCOUNTER — Telehealth: Payer: Self-pay

## 2024-01-16 DIAGNOSIS — Z79899 Other long term (current) drug therapy: Secondary | ICD-10-CM | POA: Diagnosis not present

## 2024-01-16 DIAGNOSIS — J449 Chronic obstructive pulmonary disease, unspecified: Secondary | ICD-10-CM | POA: Diagnosis not present

## 2024-01-16 DIAGNOSIS — W28XXXD Contact with powered lawn mower, subsequent encounter: Secondary | ICD-10-CM | POA: Diagnosis not present

## 2024-01-16 DIAGNOSIS — Z87891 Personal history of nicotine dependence: Secondary | ICD-10-CM | POA: Diagnosis not present

## 2024-01-16 DIAGNOSIS — I10 Essential (primary) hypertension: Secondary | ICD-10-CM | POA: Diagnosis not present

## 2024-01-16 DIAGNOSIS — S52614D Nondisplaced fracture of right ulna styloid process, subsequent encounter for closed fracture with routine healing: Secondary | ICD-10-CM | POA: Diagnosis not present

## 2024-01-16 DIAGNOSIS — J302 Other seasonal allergic rhinitis: Secondary | ICD-10-CM | POA: Diagnosis not present

## 2024-01-16 DIAGNOSIS — I4891 Unspecified atrial fibrillation: Secondary | ICD-10-CM | POA: Diagnosis not present

## 2024-01-16 NOTE — Telephone Encounter (Signed)
 Adherence Monitoring:  Overdue on rosuvastatin refills, confirmed refills available at pharmacy, requested refill.

## 2024-01-18 DIAGNOSIS — H353233 Exudative age-related macular degeneration, bilateral, with inactive scar: Secondary | ICD-10-CM | POA: Diagnosis not present

## 2024-01-18 DIAGNOSIS — H16223 Keratoconjunctivitis sicca, not specified as Sjogren's, bilateral: Secondary | ICD-10-CM | POA: Diagnosis not present

## 2024-01-18 DIAGNOSIS — Z961 Presence of intraocular lens: Secondary | ICD-10-CM | POA: Diagnosis not present

## 2024-01-24 ENCOUNTER — Telehealth: Payer: Self-pay

## 2024-01-24 NOTE — Telephone Encounter (Signed)
 Successful outreach, patient agreed to refill losartan . Will confirm refill next week

## 2024-01-30 ENCOUNTER — Telehealth: Payer: Self-pay

## 2024-01-30 NOTE — Telephone Encounter (Signed)
 Confirmed fills, now up to date on meds. Next review in September

## 2024-02-07 DIAGNOSIS — K219 Gastro-esophageal reflux disease without esophagitis: Secondary | ICD-10-CM | POA: Diagnosis not present

## 2024-02-07 DIAGNOSIS — R600 Localized edema: Secondary | ICD-10-CM | POA: Diagnosis not present

## 2024-02-07 DIAGNOSIS — F17200 Nicotine dependence, unspecified, uncomplicated: Secondary | ICD-10-CM | POA: Diagnosis not present

## 2024-02-07 DIAGNOSIS — I4891 Unspecified atrial fibrillation: Secondary | ICD-10-CM | POA: Diagnosis not present

## 2024-02-07 DIAGNOSIS — J302 Other seasonal allergic rhinitis: Secondary | ICD-10-CM | POA: Diagnosis not present

## 2024-02-07 DIAGNOSIS — I10 Essential (primary) hypertension: Secondary | ICD-10-CM | POA: Diagnosis not present

## 2024-02-07 DIAGNOSIS — J449 Chronic obstructive pulmonary disease, unspecified: Secondary | ICD-10-CM | POA: Diagnosis not present
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
## 2024-02-22 DIAGNOSIS — H35371 Puckering of macula, right eye: Secondary | ICD-10-CM | POA: Diagnosis not present

## 2024-02-22 DIAGNOSIS — D3131 Benign neoplasm of right choroid: Secondary | ICD-10-CM | POA: Diagnosis not present

## 2024-02-22 DIAGNOSIS — H353113 Nonexudative age-related macular degeneration, right eye, advanced atrophic without subfoveal involvement: Secondary | ICD-10-CM | POA: Diagnosis not present

## 2024-02-22 DIAGNOSIS — H353221 Exudative age-related macular degeneration, left eye, with active choroidal neovascularization: Secondary | ICD-10-CM | POA: Diagnosis not present

## 2024-03-01 ENCOUNTER — Encounter: Payer: Self-pay | Admitting: Cardiovascular Disease

## 2024-03-01 ENCOUNTER — Ambulatory Visit: Attending: Cardiovascular Disease | Admitting: Cardiovascular Disease

## 2024-03-01 VITALS — BP 120/76 | HR 89 | Ht 68.0 in | Wt 167.8 lb

## 2024-03-01 DIAGNOSIS — I480 Paroxysmal atrial fibrillation: Secondary | ICD-10-CM

## 2024-03-01 DIAGNOSIS — I4891 Unspecified atrial fibrillation: Secondary | ICD-10-CM | POA: Diagnosis not present

## 2024-03-01 NOTE — Patient Instructions (Signed)
 Medication Instructions:  Your physician recommends that you continue on your current medications as directed. Please refer to the Current Medication list given to you today.  *If you need a refill on your cardiac medications before your next appointment, please call your pharmacy*  Lab Work: None today If you have labs (blood work) drawn today and your tests are completely normal, you will receive your results only by: MyChart Message (if you have MyChart) OR A paper copy in the mail If you have any lab test that is abnormal or we need to change your treatment, we will call you to review the results.  Testing/Procedures: Your physician has recommended that you wear an event monitor. Event monitors are medical devices that record the heart's electrical activity. Doctors most often us  these monitors to diagnose arrhythmias. Arrhythmias are problems with the speed or rhythm of the heartbeat. The monitor is a small, portable device. You can wear one while you do your normal daily activities. This is usually used to diagnose what is causing palpitations/syncope (passing out).   Follow-Up: At [facility], you and your health needs are our priority.  As part of our continuing mission to provide you with exceptional heart care, our providers are all part of one team.  This team includes your primary Cardiologist (physician) and Advanced Practice Providers or APPs (Physician Assistants and Nurse Practitioners) who all work together to provide you with the care you need, when you need it.  Your next appointment:   As needed  Provider:   Maude Emmer, MD    We recommend signing up for the patient portal called MyChart.  Sign up information is provided on this After Visit Summary.  MyChart is used to connect with patients for Virtual Visits (Telemedicine).  Patients are able to view lab/test results, encounter notes, upcoming appointments, etc.  Non-urgent messages can be sent to your  provider as well.   To learn more about what you can do with MyChart, go to [email or web address].   Other Instructions None

## 2024-03-05 ENCOUNTER — Telehealth: Payer: Self-pay

## 2024-03-05 NOTE — Telephone Encounter (Signed)
 Received fax from [employer] that they could not reach patient to verify [facility]. I spoke with son,Jason, and gave him the number to call [employer]

## 2024-03-12 ENCOUNTER — Encounter: Payer: Self-pay | Admitting: *Deleted

## 2024-03-12 NOTE — Progress Notes (Signed)
 Received notification from [facility] [employer] that patient has monitor but it has not been activated. Patient contacted by them and has not returned calls for assistance with hook up. Son Selinda contacted and says he would advised patient to return call to [employer].

## 2024-03-14 DIAGNOSIS — H353221 Exudative age-related macular degeneration, left eye, with active choroidal neovascularization: Secondary | ICD-10-CM | POA: Diagnosis not present

## 2024-04-18 DIAGNOSIS — H31092 Other chorioretinal scars, left eye: Secondary | ICD-10-CM | POA: Diagnosis not present

## 2024-04-18 DIAGNOSIS — H35371 Puckering of macula, right eye: Secondary | ICD-10-CM | POA: Diagnosis not present

## 2024-04-18 DIAGNOSIS — D3131 Benign neoplasm of right choroid: Secondary | ICD-10-CM | POA: Diagnosis not present

## 2024-04-18 DIAGNOSIS — H353113 Nonexudative age-related macular degeneration, right eye, advanced atrophic without subfoveal involvement: Secondary | ICD-10-CM | POA: Diagnosis not present

## 2024-04-18 DIAGNOSIS — H353223 Exudative age-related macular degeneration, left eye, with inactive scar: Secondary | ICD-10-CM | POA: Diagnosis not present

## 2024-04-18 DIAGNOSIS — H26491 Other secondary cataract, right eye: Secondary | ICD-10-CM | POA: Diagnosis not present

## 2024-05-03 DIAGNOSIS — R7301 Impaired fasting glucose: Secondary | ICD-10-CM | POA: Diagnosis not present

## 2024-05-03 DIAGNOSIS — E782 Mixed hyperlipidemia: Secondary | ICD-10-CM | POA: Diagnosis not present

## 2024-05-09 DIAGNOSIS — I10 Essential (primary) hypertension: Secondary | ICD-10-CM | POA: Diagnosis not present

## 2024-05-09 DIAGNOSIS — D649 Anemia, unspecified: Secondary | ICD-10-CM | POA: Diagnosis not present

## 2024-05-09 DIAGNOSIS — J302 Other seasonal allergic rhinitis: Secondary | ICD-10-CM | POA: Diagnosis not present

## 2024-05-09 DIAGNOSIS — J449 Chronic obstructive pulmonary disease, unspecified: Secondary | ICD-10-CM | POA: Diagnosis not present

## 2024-05-09 DIAGNOSIS — I4891 Unspecified atrial fibrillation: Secondary | ICD-10-CM | POA: Diagnosis not present

## 2024-05-09 DIAGNOSIS — K219 Gastro-esophageal reflux disease without esophagitis: Secondary | ICD-10-CM | POA: Diagnosis not present

## 2024-05-09 DIAGNOSIS — F17200 Nicotine dependence, unspecified, uncomplicated: Secondary | ICD-10-CM | POA: Diagnosis not present

## 2024-05-09 DIAGNOSIS — R600 Localized edema: Secondary | ICD-10-CM | POA: Diagnosis not present

## 2024-05-09 DIAGNOSIS — Z0001 Encounter for general adult medical examination with abnormal findings: Secondary | ICD-10-CM | POA: Diagnosis not present

## 2024-05-30 ENCOUNTER — Telehealth: Payer: Self-pay

## 2024-05-30 NOTE — Telephone Encounter (Signed)
 Received fax from [employer] stating that pt declined monitor. Monitor to be shipped back to company and testing canceled.

## 2024-07-26 ENCOUNTER — Encounter (INDEPENDENT_AMBULATORY_CARE_PROVIDER_SITE_OTHER): Payer: Self-pay | Admitting: *Deleted

## 2024-09-12 ENCOUNTER — Other Ambulatory Visit (HOSPITAL_COMMUNITY): Payer: Self-pay | Admitting: Internal Medicine

## 2024-09-12 DIAGNOSIS — F17200 Nicotine dependence, unspecified, uncomplicated: Secondary | ICD-10-CM
